# Patient Record
Sex: Female | Born: 1990 | Hispanic: No | Marital: Single | Smoking: Never smoker
Health system: Southern US, Community
[De-identification: ages and names within clinical notes are randomized; demographics above are authoritative.]

## PROBLEM LIST (undated history)

## (undated) DIAGNOSIS — Z9884 Bariatric surgery status: Secondary | ICD-10-CM

## (undated) DIAGNOSIS — M25569 Pain in unspecified knee: Secondary | ICD-10-CM

## (undated) HISTORY — PX: OTHER SURGICAL HISTORY: SHX169

## (undated) HISTORY — PX: ABDOMINAL SURGERY: SHX537

---
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
## 2010-07-07 IMAGING — CR DG ANKLE COMPLETE 3+V*L*
3 series · 3 of 3 positions shown · non-contrast
Comparison: None available.

CLINICAL DATA: Fall, pain.

LEFT ANKLE COMPLETE - 3+ VIEW

[t ankle joint ap left]
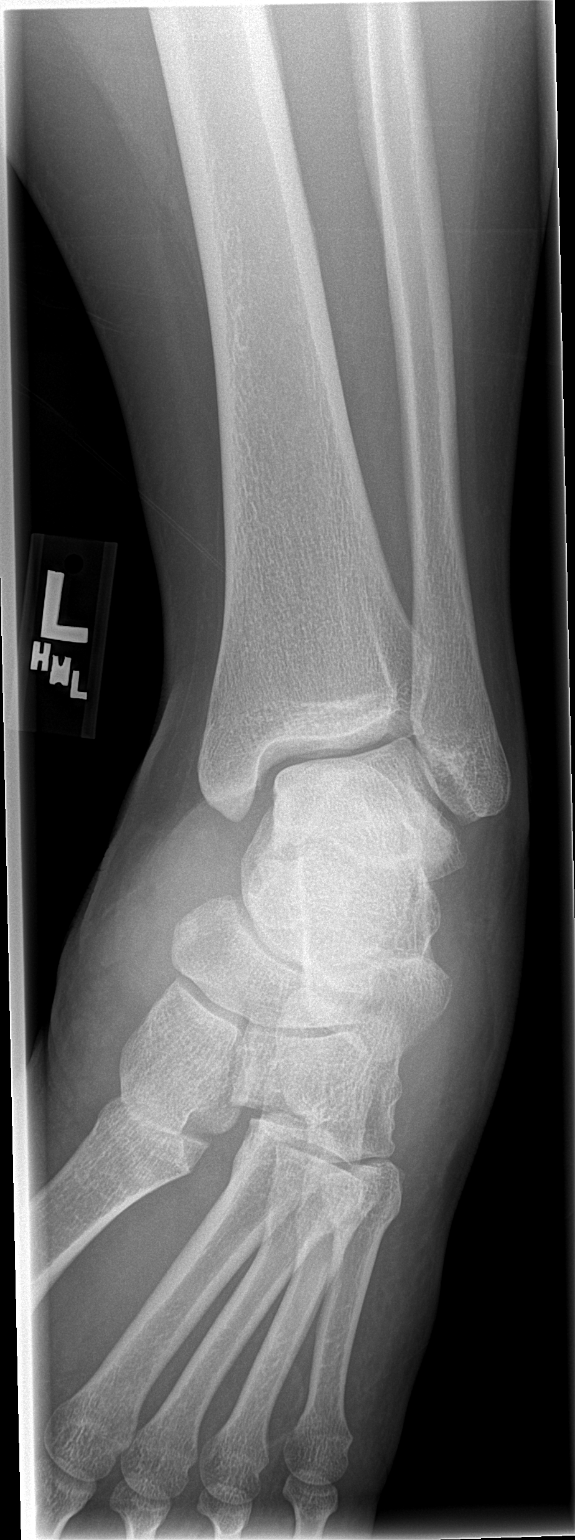

[t ankle joint oblique left]
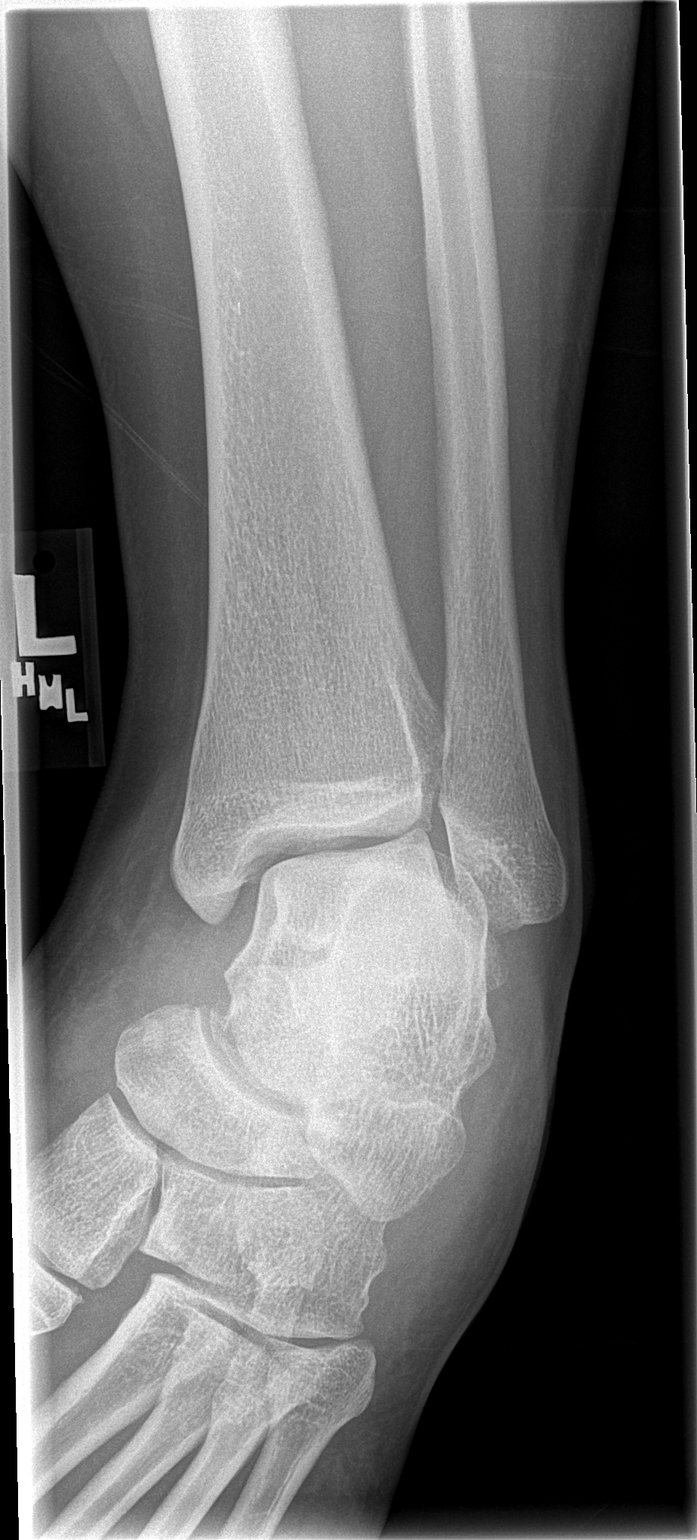

[t ankle joint lat left]
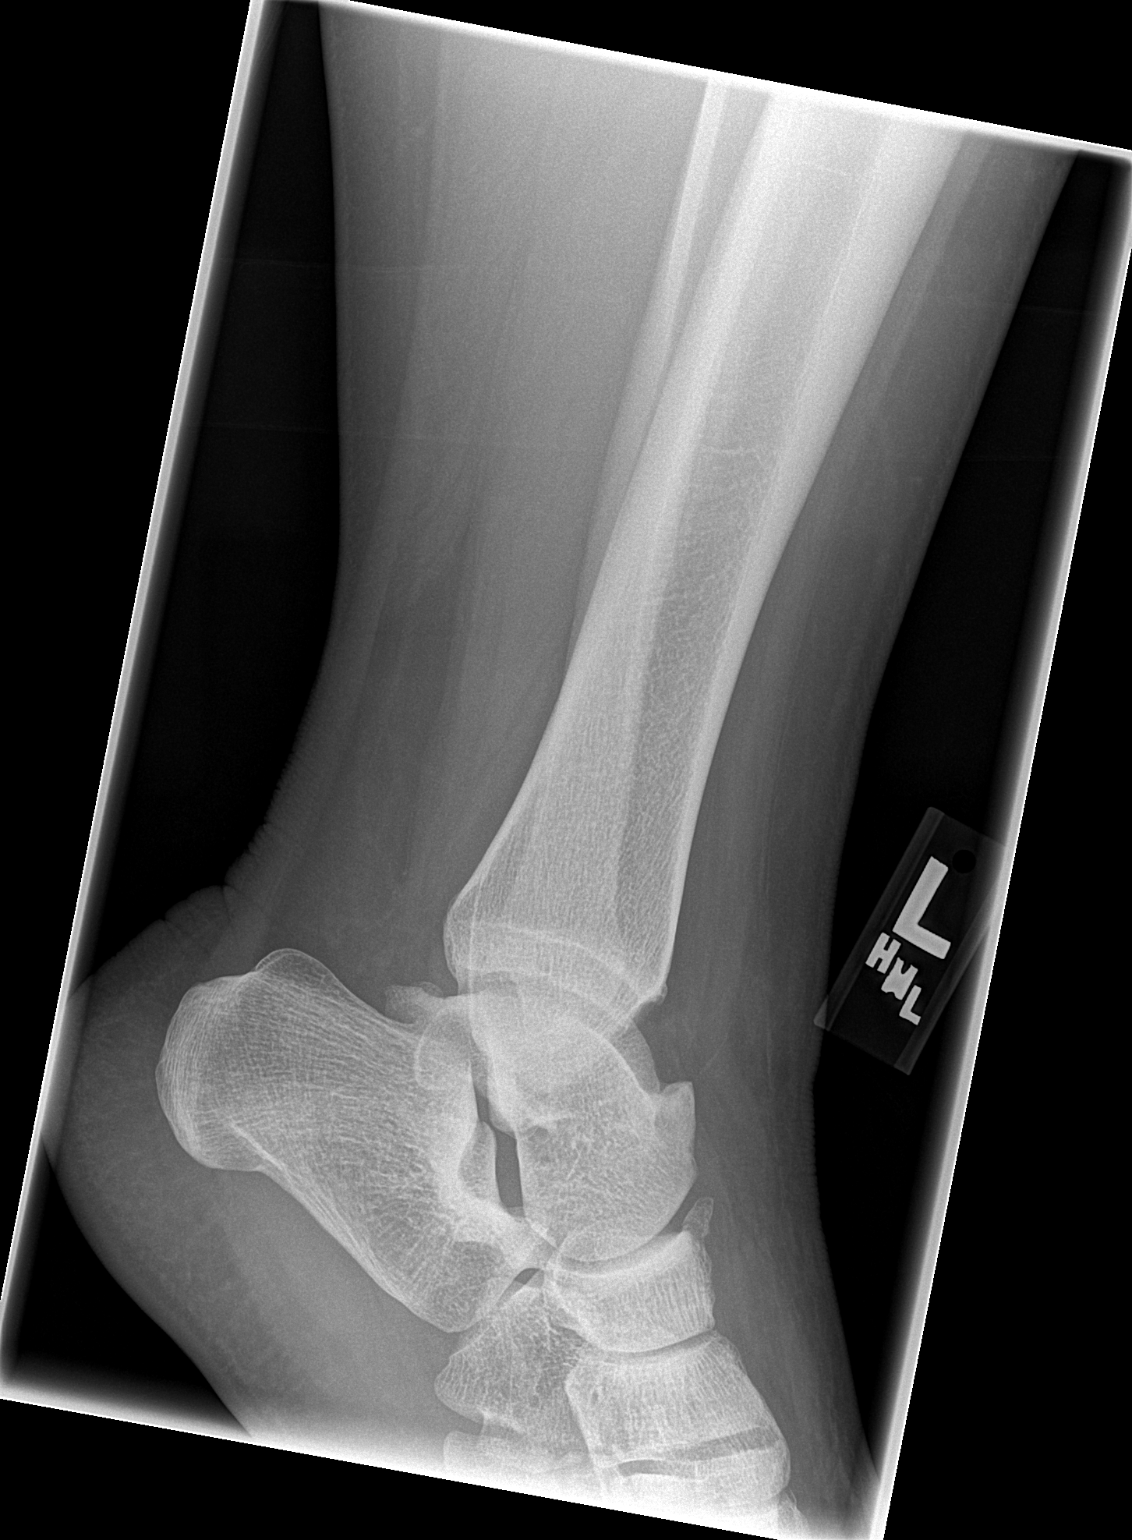

[3 of 3 positions shown; findings below may reference images not displayed]

FINDINGS: The middle and medial cuneiforms appear abnormally
widened as do the first and second metatarsals.  No discrete
fracture is identified.  Small accessory ossicle of the navicular
bone noted.  Soft tissues the foot appears swollen.
IMPRESSION: 1.  Findings worrisome for Lisfranc injury.  Consider MRI for
further evaluation.
2.  No fracture is identified.

REF:Z9 DICTATED: 01/08/2009 [DATE]

## 2010-07-07 IMAGING — CR DG ANKLE COMPLETE 3+V*R*
3 series · 3 of 3 positions shown · non-contrast
Comparison: None available.

CLINICAL DATA: Fall.  Ankle pain.

RIGHT ANKLE - COMPLETE 3+ VIEW

[t ankle joint ap right]
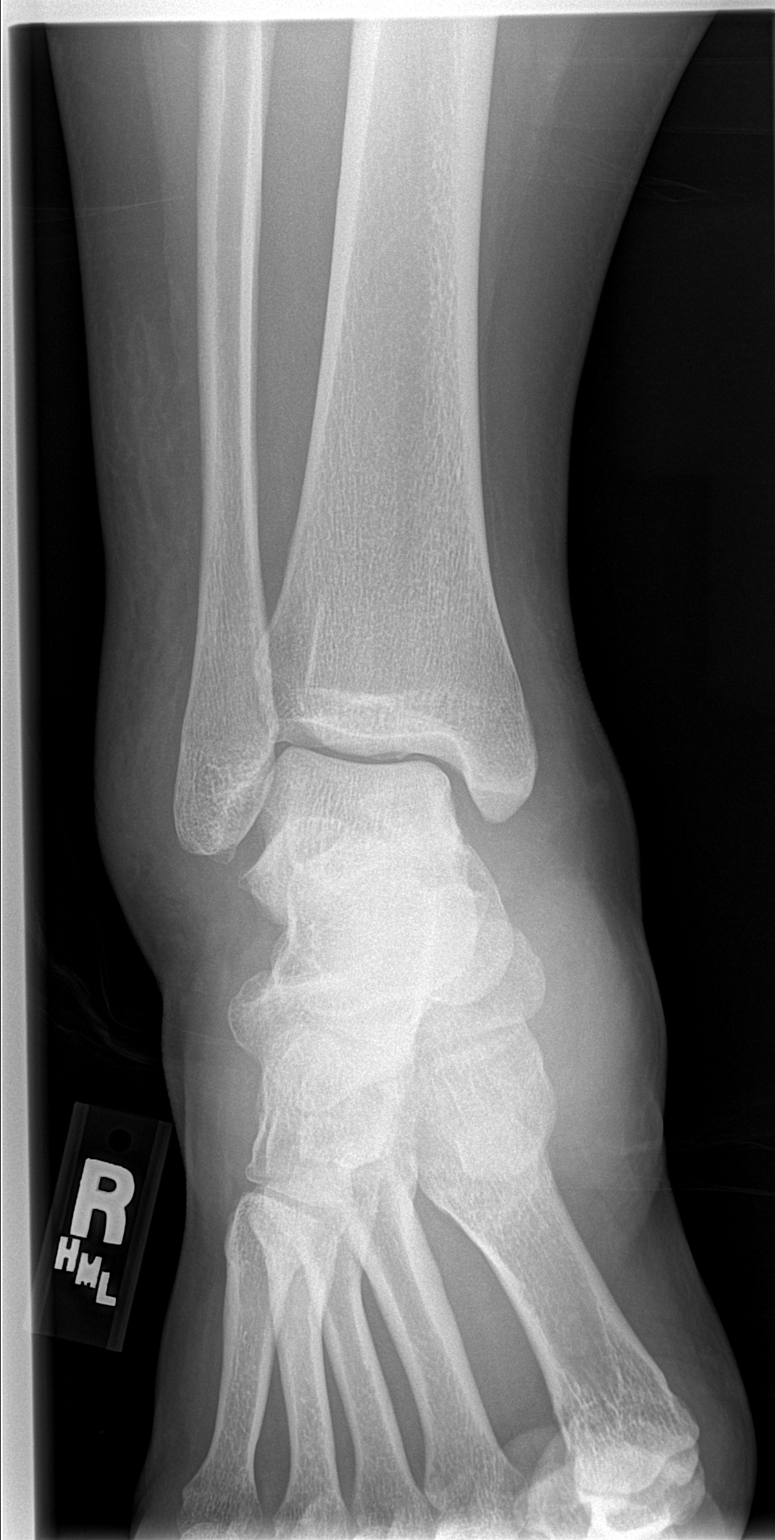

[t ankle joint oblique right]
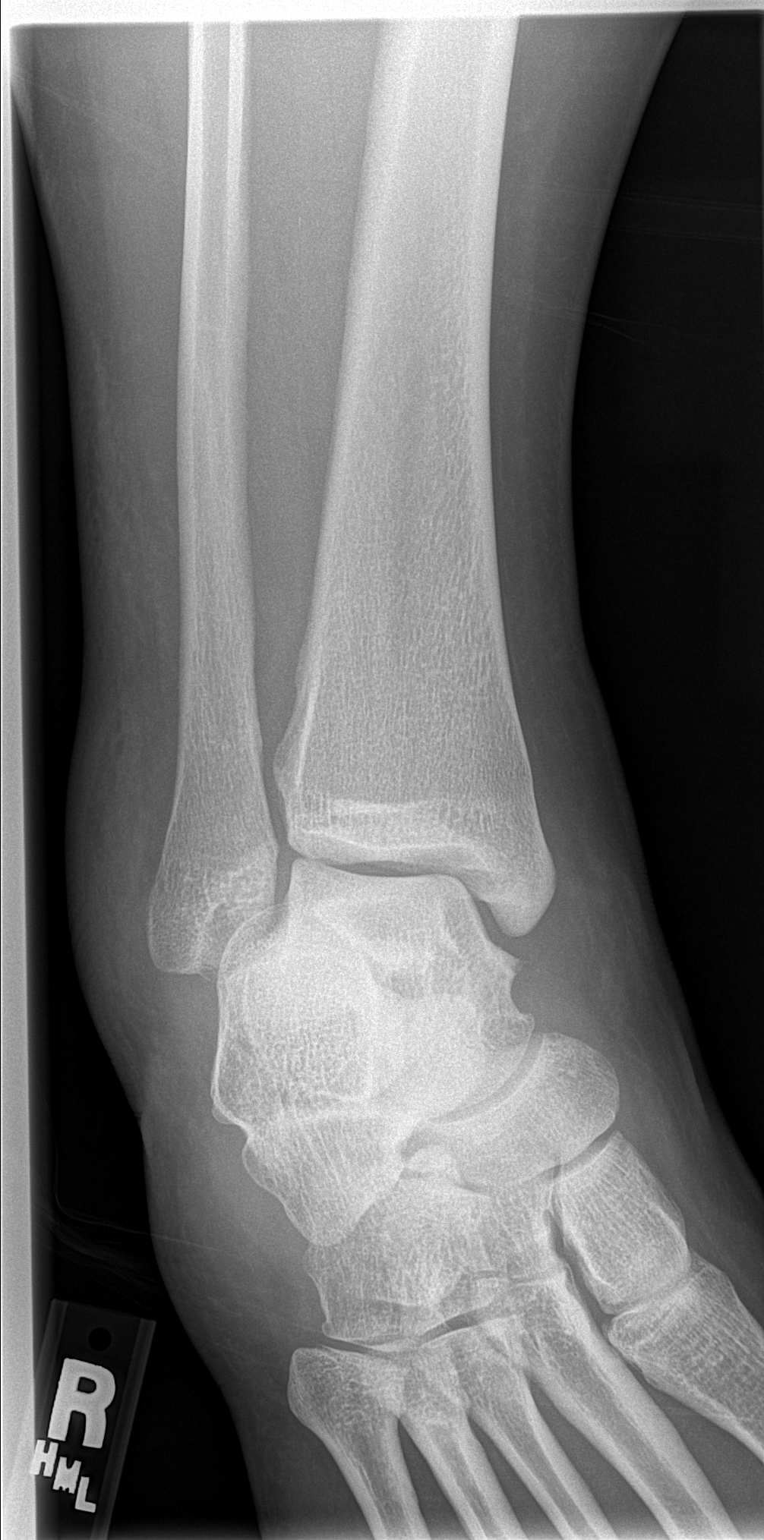

[t ankle joint lat right]
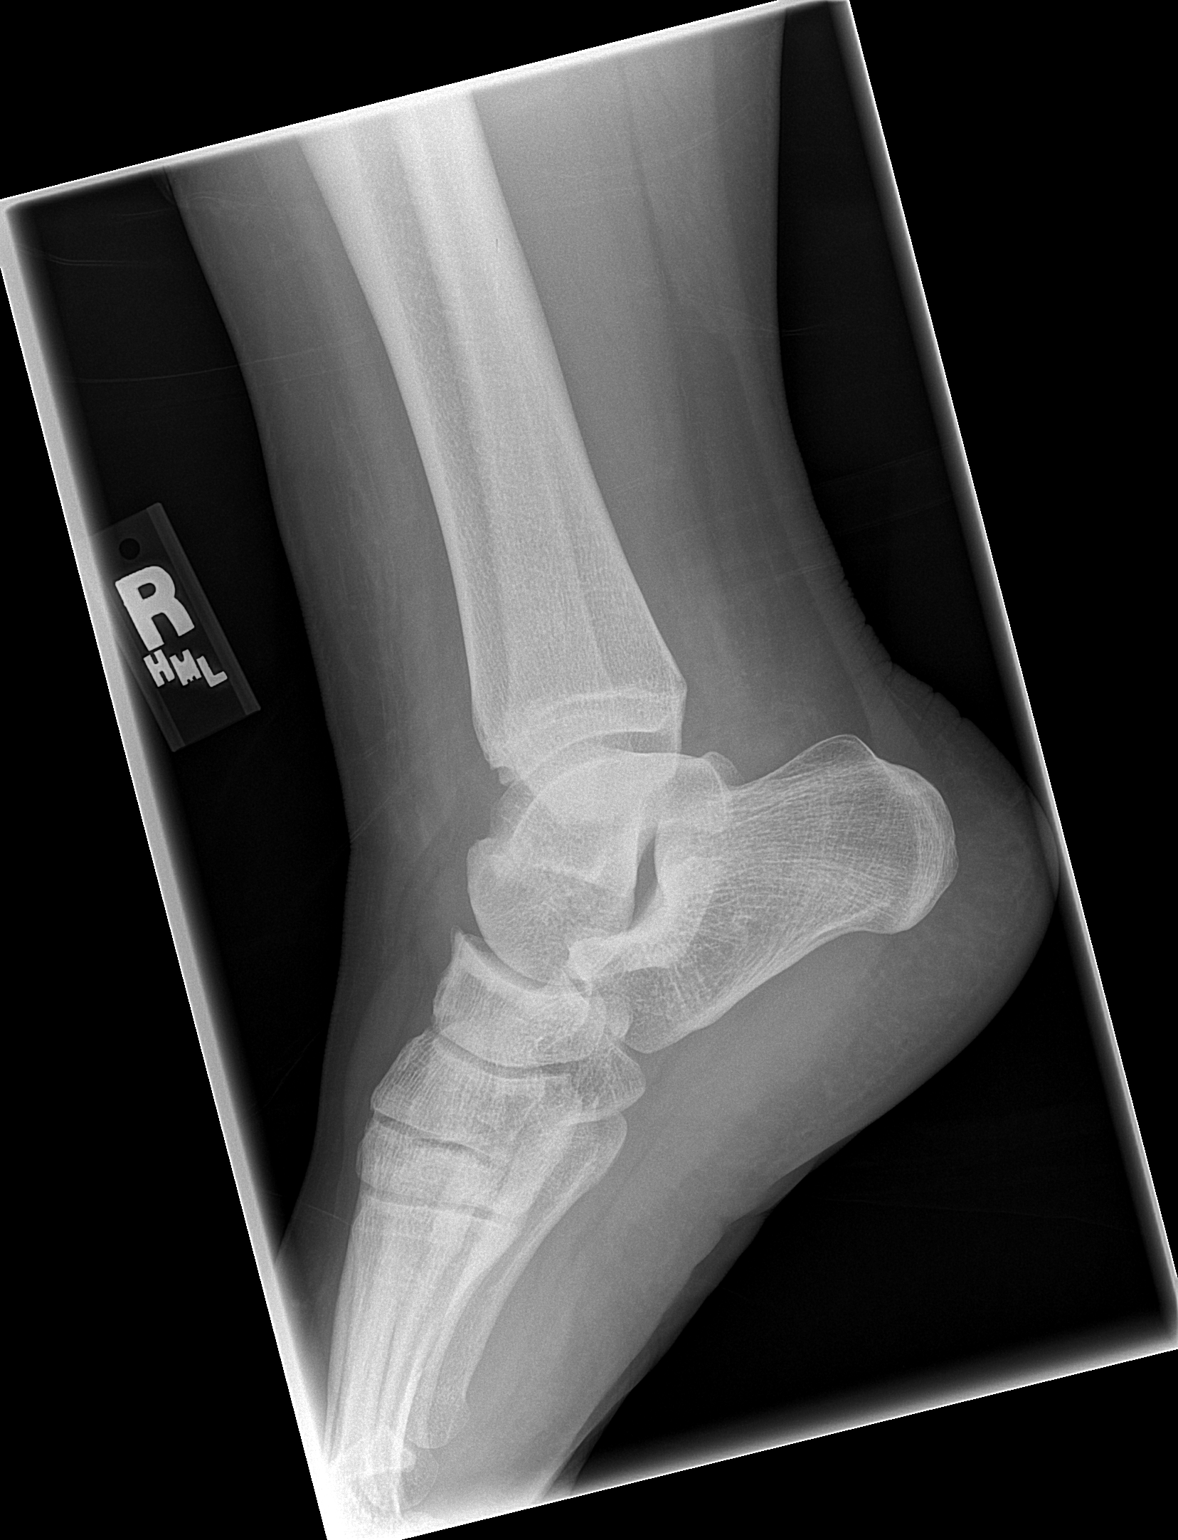

[3 of 3 positions shown; findings below may reference images not displayed]

FINDINGS: Imaged bones, joints and soft tissues appear normal.
IMPRESSION: Negative exam.

REF:Z9 DICTATED: 01/08/2009 [DATE]

## 2013-04-05 ENCOUNTER — Emergency Department (HOSPITAL_COMMUNITY)
Admission: EM | Admit: 2013-04-05 | Discharge: 2013-04-06 | Disposition: A | Discharge status: Home / Self Care | Payer: BC Managed Care – PPO | Attending: Emergency Medicine | Admitting: Emergency Medicine

## 2013-04-05 ENCOUNTER — Encounter (HOSPITAL_COMMUNITY): Payer: Self-pay | Admitting: Emergency Medicine

## 2013-04-05 DIAGNOSIS — L509 Urticaria, unspecified: Secondary | ICD-10-CM

## 2013-04-05 DIAGNOSIS — L739 Follicular disorder, unspecified: Secondary | ICD-10-CM | POA: Insufficient documentation

## 2013-04-05 DIAGNOSIS — L299 Pruritus, unspecified: Secondary | ICD-10-CM | POA: Insufficient documentation

## 2013-04-05 NOTE — ED Notes (Signed)
Pt states that she has been having allergic reactions for approx. A month. Has changed body wash but still has hives and itching.

## 2013-04-05 NOTE — ED Provider Notes (Signed)
History    This chart was scribed for non-physician practitioner Ebbie Ridge, PA-C working with Hanley Seamen, MD by Gerlean Ren, ED Scribe. This patient was seen in room WTR8/WTR8 and the patient's care was started at 11:43 PM.    CSN: [id number]  Arrival date & time 04/05/13  2330   First MD Initiated Contact with Patient 04/05/13 2336      No chief complaint on file.    The history is provided by the patient. No language interpreter was used.  Crystal Collier is a 22 y.o. female who presents to the Emergency Department complaining of itching rashes over pt's entire body that begins between 3:00-5:00 PM each day and will gradually improve until the evening when it will begin greatly worsening.  Pt reports this has been occuring for the past month.  Pt denies any new soaps, lotions, detergents.  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  Pt states she recently changed her birth control but stopped taking it altogether 2 weeks ago.   No past medical history on file.  No past surgical history on file.  No family history on file.  History  Substance Use Topics  . Smoking status: Not on file  . Smokeless tobacco: Not on file  . Alcohol Use: Not on file    OB History   No data available      Review of Systems A complete 10 system review of systems was obtained and all systems are negative except as noted in the HPI and PMH.   Allergies  Review of patient's allergies indicates not on file.  Home Medications  No current outpatient prescriptions on file.  There were no vitals taken for this visit.  Physical Exam  Nursing note and vitals reviewed. Constitutional: She is oriented to person, place, and time. She appears well-developed and well-nourished. No distress.  HENT:  Head: Normocephalic and atraumatic.  Eyes: Pupils are equal, round, and reactive to light.  Neck: Normal range of motion. Neck supple.   Cardiovascular: Normal rate, regular rhythm and normal heart sounds.  Exam reveals no gallop and no friction rub.   No murmur heard. Pulmonary/Chest: Effort normal and breath sounds normal. No respiratory distress. She has no wheezes.  Musculoskeletal: Normal range of motion.  Neurological: She is alert and oriented to person, place, and time.  Skin: Skin is warm and dry. Rash noted.  Patient does have some upper extremities, face, chest and back  Psychiatric: She has a normal mood and affect. Her behavior is normal.    ED Course  Procedures (including critical care time) DIAGNOSTIC STUDIES: No O2 taken.    COORDINATION OF CARE: 11:47 PM- Patient informed of clinical course, understands medical decision-making process, and agrees with plan.   Patient is referred to [facility].  Told to return here as, needed.  We'll give her a prescription for Pepcid, prednisone, and Benadryl.  Patient is advised to monitor, or any new variations in her soaps, lotions or detergents   MDM  I personally performed the services described in this documentation, which was scribed in my presence. The recorded information has been reviewed and is accurate.   Carlyle Dolly, PA-C 04/06/13 [phone]

## 2013-04-06 MED ORDER — FAMOTIDINE 20 MG PO TABS: 20.0000 mg | ORAL_TABLET | Freq: Two times a day (BID) | ORAL | Status: DC

## 2013-04-06 MED ORDER — DEXAMETHASONE SODIUM PHOSPHATE 10 MG/ML IJ SOLN
10.0000 mg | Freq: Once | INTRAMUSCULAR | Status: AC
  Administered 2013-04-06: 10 mg via INTRAVENOUS
  Filled 2013-04-06: qty 1

## 2013-04-06 MED ORDER — DIPHENHYDRAMINE HCL 50 MG/ML IJ SOLN
25.0000 mg | Freq: Once | INTRAMUSCULAR | Status: AC
  Administered 2013-04-06: 25 mg via INTRAMUSCULAR
  Filled 2013-04-06: qty 1

## 2013-04-06 MED ORDER — FAMOTIDINE 20 MG PO TABS
20.0000 mg | ORAL_TABLET | Freq: Once | ORAL | Status: AC
  Administered 2013-04-06: 20 mg via ORAL
  Filled 2013-04-06: qty 1

## 2013-04-06 MED ORDER — PREDNISONE 50 MG PO TABS: 50.0000 mg | ORAL_TABLET | Freq: Every day | ORAL | Status: DC

## 2013-04-06 NOTE — ED Provider Notes (Signed)
Medical screening examination/treatment/procedure(s) were performed by non-physician practitioner and as supervising physician I was immediately available for consultation/collaboration.   Hanley Seamen, MD 04/06/13 [phone]

## 2013-06-07 ENCOUNTER — Institutional Professional Consult (permissible substitution): Payer: BC Managed Care – PPO | Admitting: Internal Medicine

## 2013-11-30 ENCOUNTER — Ambulatory Visit: Payer: Self-pay

## 2019-10-11 ENCOUNTER — Emergency Department (INDEPENDENT_AMBULATORY_CARE_PROVIDER_SITE_OTHER)
Admission: EM | Admit: 2019-10-11 | Discharge: 2019-10-11 | Disposition: A | Discharge status: Home / Self Care | Payer: Managed Care, Other (non HMO) | Source: Home / Self Care | Attending: Family Medicine | Admitting: Family Medicine

## 2019-10-11 ENCOUNTER — Other Ambulatory Visit: Payer: Self-pay

## 2019-10-11 DIAGNOSIS — Z5189 Encounter for other specified aftercare: Secondary | ICD-10-CM | POA: Diagnosis not present

## 2019-10-11 MED ORDER — DOXYCYCLINE HYCLATE 100 MG PO CAPS: ORAL_CAPSULE | ORAL | 0 refills | Status: AC

## 2019-10-11 NOTE — ED Provider Notes (Signed)
[facility]    CSN: [id number] Arrival date & time: 10/11/19  1713      History   Chief Complaint Chief Complaint  Patient presents with  . Wound Infection    pt removed bilateral JP drains post skin surgery herself    HPI Crystal Collier is a 28 y.o. female.   Patient underwent a "tumm tuck" surgical procedure in [place] about 15 days ago.  Upon return to  she removed bilateral JP drains from each end of her lower abdominal incision because the drainage was beginning to appear purulent.  She has just finished a 7 day course of Keflex.  She denies fever, abdominal/pelvic pain, or increasing incisional pain.  The history is provided by the patient.    History reviewed. No pertinent past medical history.  There are no active problems to display for this patient.   Past Surgical History:  Procedure Laterality Date  . abdominal plasty      OB History   No obstetric history on file.      Home Medications    Prior to Admission medications   Medication Sig Start Date End Date Taking? Authorizing Provider  doxycycline (VIBRAMYCIN) 100 MG capsule Take one cap by mouth every 12 hours with food. 10/11/19   Lattie Haw, MD    Family History History reviewed. No pertinent family history.  Social History Social History   Tobacco Use  . Smoking status: Never Smoker  . Smokeless tobacco: Never Used  Substance Use Topics  . Alcohol use: Never    Frequency: Never  . Drug use: Never     Allergies   Patient has no known allergies.   Review of Systems Review of Systems  Constitutional: Negative for activity change, appetite change, chills, diaphoresis, fatigue and fever.  Respiratory: Negative for chest tightness and shortness of breath.   Cardiovascular: Negative for chest pain and palpitations.  Gastrointestinal: Negative for abdominal distention, abdominal pain and nausea.  Genitourinary: Negative.   Musculoskeletal: Negative.   Skin:  Positive for wound. Negative for color change.  All other systems reviewed and are negative.    Physical Exam Triage Vital Signs ED Triage Vitals  Enc Vitals Group     BP 10/11/19 1833 (!) 143/93     Pulse Rate 10/11/19 1833 94     Resp 10/11/19 1833 20     Temp 10/11/19 1833 98.5 F (36.9 C)     Temp Source 10/11/19 1833 Oral     SpO2 10/11/19 1833 100 %     Weight 10/11/19 1834 206 lb (93.4 kg)     Height 10/11/19 1834 5\' 5"  (1.651 m)     Head Circumference --      Peak Flow --      Pain Score 10/11/19 1834 0     Pain Loc --      Pain Edu? --      Excl. in [place]? --    No data found.  Updated Vital Signs BP (!) 143/93 (BP Location: Right Arm)   Pulse 94   Temp 98.5 F (36.9 C) (Oral)   Resp 20   Ht 5\' 5"  (1.651 m)   Wt 93.4 kg   LMP 09/26/2019 (Exact Date)   SpO2 100%   BMI 34.28 kg/m   Visual Acuity Right Eye Distance:   Left Eye Distance:   Bilateral Distance:    Right Eye Near:   Left Eye Near:    Bilateral Near:  Physical Exam Vitals signs reviewed.  Constitutional:      General: She is not in acute distress. Eyes:     Pupils: Pupils are equal, round, and reactive to light.  Cardiovascular:     Rate and Rhythm: Normal rate.  Pulmonary:     Effort: Pulmonary effort is normal.  Abdominal:     General: Abdomen is flat. There is no distension.     Tenderness: There is no guarding.       Comments: Lower abdominal surgical incision appears to be healing well with minimal tenderness to palpation.  There is no erythema or warmth.  At each end of the incision at site of JP drain removal there is some purulent drainage present. There is also a small amount of purulent drainage at incision site in umbilicus.  Neurological:     Mental Status: She is alert.      UC Treatments / Results  Labs (all labs ordered are listed, but only abnormal results are displayed) Labs Reviewed  WOUND CULTURE    EKG   Radiology No results found.  Procedures  Procedures (including critical care time)  Medications Ordered in UC Medications - No data to display  Initial Impression / Assessment and Plan / UC Course  I have reviewed the triage vital signs and the nursing notes.  Pertinent labs & imaging results that were available during my care of the patient were reviewed by me and considered in my medical decision making (see chart for details).    ? Developing surgical wound infection.  Will begin doxycycline for staph coverage.  Wound culture pending. Followup with Family Doctor if not improving about 4 days.   Final Clinical Impressions(s) / UC Diagnoses   Final diagnoses:  Visit for wound check     Discharge Instructions     Change bandages daily as you are presently doing. If symptoms become significantly worse during the night or over the weekend, proceed to the local emergency room.     ED Prescriptions    Medication Sig Dispense Auth. Provider   doxycycline (VIBRAMYCIN) 100 MG capsule Take one cap by mouth every 12 hours with food. 20 capsule Kandra Nicolas, MD        Kandra Nicolas, MD 10/12/19 [phone]

## 2019-10-11 NOTE — ED Triage Notes (Signed)
Pt presents to clinic for suspected wound infection. On 09/26/2019 she underwent a "tummy tuck" in [place]. Upon return to [place] she removed bilateral groin JP drains (x2) by herself due to yellow thick drainage coming from both sites. She has finished a 7 day course of Keflex and was prescribed flexeril and norco for pain. Pt has received influenza vaccine for this season.

## 2019-10-11 NOTE — Discharge Instructions (Addendum)
Change bandages daily as you are presently doing. If symptoms become significantly worse during the night or over the weekend, proceed to the local emergency room.

## 2019-10-15 ENCOUNTER — Telehealth: Payer: Self-pay

## 2019-10-15 LAB — WOUND CULTURE
MICRO NUMBER:: 1109414
SPECIMEN QUALITY:: ADEQUATE

## 2019-10-15 MED ORDER — CIPROFLOXACIN HCL 500 MG PO TABS: ORAL_TABLET | ORAL | 0 refills | Status: AC

## 2019-10-15 NOTE — Telephone Encounter (Signed)
Urine culture shows pseudomonas aeruginosa, sensitive to Cipro. Discontinue doxycycline and begin Cipro 500mg  BID for 10 days.

## 2019-11-28 ENCOUNTER — Emergency Department
Admission: EM | Admit: 2019-11-28 | Discharge: 2019-11-28 | Disposition: A | Discharge status: Home / Self Care | Payer: Managed Care, Other (non HMO) | Source: Home / Self Care | Attending: Family Medicine | Admitting: Family Medicine

## 2019-11-28 ENCOUNTER — Other Ambulatory Visit: Payer: Self-pay

## 2019-11-28 ENCOUNTER — Encounter: Payer: Self-pay | Admitting: Emergency Medicine

## 2019-11-28 DIAGNOSIS — Z20822 Contact with and (suspected) exposure to covid-19: Secondary | ICD-10-CM

## 2019-11-28 DIAGNOSIS — R059 Cough, unspecified: Secondary | ICD-10-CM

## 2019-11-28 DIAGNOSIS — R05 Cough: Secondary | ICD-10-CM

## 2019-11-28 LAB — POC SARS CORONAVIRUS 2 AG -  ED: SARS Coronavirus 2 Ag: NEGATIVE

## 2019-11-28 NOTE — ED Triage Notes (Signed)
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 No OTCs. Has had influenza vacc this season.

## 2019-11-28 NOTE — ED Provider Notes (Signed)
[facility]    CSN: [id number] Arrival date & time: 11/28/19  1008      History   Chief Complaint Chief Complaint  Patient presents with  . Cough  . Generalized Body Aches  . Headache  . Fatigue    HPI Crystal Collier is a 29 y.o. female.   Patient developed a cough 3 days ago, with fatigue, myalgias, mild nasal congestion, minimal sore throat, and chills.  She has had a headache today.   She denies chest tightness, shortness of breath, and changes in taste/smell.    The history is provided by the patient.    History reviewed. No pertinent past medical history.  There are no problems to display for this patient.   Past Surgical History:  Procedure Laterality Date  . abdominal plasty    . ABDOMINAL SURGERY     weight reduction    OB History   No obstetric history on file.      Home Medications    Prior to Admission medications   Medication Sig Start Date End Date Taking? Authorizing Provider  ciprofloxacin (CIPRO) 500 MG tablet Take one tab PO Q12hr. 10/15/19   Kandra Nicolas, MD  doxycycline (VIBRAMYCIN) 100 MG capsule Take one cap by mouth every 12 hours with food. 10/11/19   Kandra Nicolas, MD    Family History No family history on file.  Social History Social History   Tobacco Use  . Smoking status: Never Smoker  . Smokeless tobacco: Never Used  Substance Use Topics  . Alcohol use: Never  . Drug use: Never     Allergies   Patient has no known allergies.   Review of Systems Review of Systems + minimal sore throat + cough No pleuritic pain No wheezing + nasal congestion + post-nasal drainage No sinus pain/pressure No itchy/red eyes No earache No hemoptysis No SOB No fever, + chills No nausea No vomiting No abdominal pain No diarrhea No urinary symptoms No skin rash + fatigue + myalgias + headache    Physical Exam Triage Vital Signs ED Triage Vitals [11/28/19 1113]  Enc Vitals Group     BP (!) 149/95       Pulse Rate 90     Resp 18     Temp 98.5 F (36.9 C)     Temp Source Oral     SpO2 100 %     Weight 200 lb (90.7 kg)     Height 5\' 5"  (1.651 m)     Head Circumference      Peak Flow      Pain Score 2     Pain Loc      Pain Edu?      Excl. in [place]?    No data found.  Updated Vital Signs BP (!) 149/95 (BP Location: Right Arm)   Pulse 90   Temp 98.5 F (36.9 C) (Oral)   Resp 18   Ht 5\' 5"  (1.651 m)   Wt 90.7 kg   LMP 11/05/2019 (Exact Date)   SpO2 100%   BMI 33.28 kg/m   Visual Acuity Right Eye Distance:   Left Eye Distance:   Bilateral Distance:    Right Eye Near:   Left Eye Near:    Bilateral Near:     Physical Exam Nursing notes and Vital Signs reviewed. Appearance:  Patient appears stated age, and in no acute distress Eyes:  Pupils are equal, round, and reactive to light and accomodation.  Extraocular  movement is intact.  Conjunctivae are not inflamed  Ears:  Canals are occluded with cerumen. Nose:   Normal turbinates.  No sinus tenderness.   Pharynx:  Normal Neck:  Supple.  Mildly enlarged lateral nodes are present, tender to palpation on the left.   Lungs:  Clear to auscultation.  Breath sounds are equal.  Moving air well. Heart:  Regular rate and rhythm without murmurs, rubs, or gallops.  Abdomen:  Nontender without masses or hepatosplenomegaly.  Bowel sounds are present.  No CVA or flank tenderness.  Extremities:  No edema.  Skin:  No rash present.   UC Treatments / Results  Labs (all labs ordered are listed, but only abnormal results are displayed) Labs Reviewed  NOVEL CORONAVIRUS, NAA  POC SARS CORONAVIRUS 2 AG -  ED negative    EKG   Radiology No results found.  Procedures Procedures (including critical care time)  Medications Ordered in UC Medications - No data to display  Initial Impression / Assessment and Plan / UC Course  I have reviewed the triage vital signs and the nursing notes.  Pertinent labs & imaging results that were  available during my care of the patient were reviewed by me and considered in my medical decision making (see chart for details).    Benign exam.  There is no evidence of bacterial infection today.  Treat symptomatically for now  COVID19 send out   Final Clinical Impressions(s) / UC Diagnoses   Final diagnoses:  Exposure to COVID-19 virus  Cough     Discharge Instructions     Increase Vitamin D3 to 5000 units daily, and Vitamin C 500mg  twice daily.  Isolate yourself until COVID-19 test result is available.   If your COVID19 test is positive, then you are infected with the novel coronavirus and could give the virus to others.  Please continue isolation at home for at least 10 days since the start of your symptoms.   Once you complete your 10 day quarantine, you may return to normal activities as long as you've not had a fever for over 24 hours (without taking fever reducing medicine) and your symptoms are improving. Please continue good preventive care measures, including:  frequent hand-washing, avoid touching your face, cover coughs/sneezes, stay out of crowds and keep a 6 foot distance from others.  Go to the nearest hospital emergency room if fever/cough/breathlessness are severe or illness seems like a threat to life.    ED Prescriptions    None        , MD 12/04/19 1306

## 2019-11-28 NOTE — Discharge Instructions (Addendum)
Increase Vitamin D3 to 5000 units daily, and Vitamin C 500mg  twice daily.  Isolate yourself until COVID-19 test result is available.   If your COVID19 test is positive, then you are infected with the novel coronavirus and could give the virus to others.  Please continue isolation at home for at least 10 days since the start of your symptoms.   Once you complete your 10 day quarantine, you may return to normal activities as long as you've not had a fever for over 24 hours (without taking fever reducing medicine) and your symptoms are improving. Please continue good preventive care measures, including:  frequent hand-washing, avoid touching your face, cover coughs/sneezes, stay out of crowds and keep a 6 foot distance from others.  Go to the nearest hospital emergency room if fever/cough/breathlessness are severe or illness seems like a threat to life.

## 2019-11-29 LAB — NOVEL CORONAVIRUS, NAA: SARS-CoV-2, NAA: DETECTED — AB

## 2019-11-30 ENCOUNTER — Telehealth: Payer: Self-pay | Admitting: Emergency Medicine

## 2019-11-30 NOTE — Telephone Encounter (Signed)

## 2019-11-30 NOTE — Telephone Encounter (Signed)
Patient contacted by phone and made aware of  positive covid  results. Pt verbalized understanding and had all questions answered.    

## 2020-08-09 NOTE — Progress Notes (Signed)
Formatting of this note might be different from the original.  Patient does not have any questions or concerns at this time.   Electronically signed by Roland Earl Villatoro-Pineda at 08/09/2020  3:06 PM EDT

## 2020-08-09 NOTE — Progress Notes (Signed)
Formatting of this note might be different from the original.  RETURN OB VISIT SUMMARY    29 y.o. G1P0000 at [redacted]w[redacted]d     SUBJECTIVE:  The patient has no unusual complaints.    OBJECTIVE:  Physical Exam:  SEE PRENATAL FLOWSHEET    ASSESSMENT:  Normal pregnancy    PLAN:  Routine prenatal care  F/u US in 8 weeks     Electronically signed by Alvester Morin, PA at 08/09/2020  3:06 PM EDT

## 2022-10-30 LAB — HEMOGLOBIN A1C
Estimated Avg Glucose, External: 96 mg/dL (ref 91–123)
Hemoglobin A1C, External: 5 % (ref 4.8–5.6)

## 2023-02-24 ENCOUNTER — Inpatient Hospital Stay: Admit: 2023-02-24 | Payer: BLUE CROSS/BLUE SHIELD | Primary: Diagnostic Radiology

## 2023-02-25 LAB — HEPATITIS B SURFACE ANTIBODY QUANT: Hep B S Ab: 51 m[IU]/mL

## 2023-02-26 LAB — VARICELLA ZOSTER ANTIBODY, IGG: Varicella-Zoster Virus Ab, Igg: 2.94 (ref 0.90–999.00)

## 2023-02-26 LAB — MUMPS ANTIBODY, IGG: Mumps IgG: 0.94 (ref 0.50–999.00)

## 2023-02-26 LAB — RUBEOLA ANTIBODY IGG: MEASLES ANTIBODY IGG: 1.03 (ref 0.70–999.00)

## 2023-02-28 LAB — QUANTIFERON, INCUBATED
Mitogen-NIL: 7.54 IU/mL
NIL: 0.01 IU/mL
QuantiFERON-TB Plus,4T: NEGATIVE
TB1-NIL: 0 IU/mL
TB2-NIL: 0 IU/mL

## 2023-02-28 LAB — RUBELLA ANTIBODY, IGG: Rubella Ab (IgG): REACTIVE

## 2023-05-28 ENCOUNTER — Inpatient Hospital Stay: Admit: 2023-05-28 | Discharge: 2023-05-28 | Payer: BLUE CROSS/BLUE SHIELD | Primary: Diagnostic Radiology

## 2023-05-28 ENCOUNTER — Inpatient Hospital Stay: Admit: 2023-05-28 | Discharge: 2023-05-28 | Payer: BLUE CROSS/BLUE SHIELD

## 2023-05-28 ENCOUNTER — Encounter

## 2023-05-28 ENCOUNTER — Inpatient Hospital Stay: Admit: 2023-05-28 | Payer: BLUE CROSS/BLUE SHIELD

## 2023-05-28 DIAGNOSIS — Z01810 Encounter for preprocedural cardiovascular examination: Secondary | ICD-10-CM

## 2023-05-28 LAB — CBC WITH AUTO DIFFERENTIAL
Basophils: 0.4 % (ref 0–3)
Eosinophils: 1.6 % (ref 0–5)
Hematocrit: 38.2 % (ref 35.0–47.0)
Hemoglobin: 11.4 gm/dl (ref 11.0–16.0)
Immature Granulocytes %: 0.5 % (ref 0.0–3.0)
Lymphocytes: 29.3 % (ref 28–48)
MCH: 20.3 pg — ABNORMAL LOW (ref 25.4–34.6)
MCHC: 29.8 gm/dl — ABNORMAL LOW (ref 30.0–36.0)
MCV: 68 fL — ABNORMAL LOW (ref 80.0–98.0)
MPV: 11.6 fL — ABNORMAL HIGH (ref 6.0–10.0)
Monocytes: 6.1 % (ref 1–13)
Neutrophils Segmented: 62.1 % (ref 34–64)
Nucleated RBCs: 0 (ref 0–0)
Platelets: 343 10*3/uL (ref 140–450)
RBC: 5.62 M/uL — ABNORMAL HIGH (ref 3.60–5.20)
RDW: 38.2 (ref 36.4–46.3)
WBC: 11.1 10*3/uL — ABNORMAL HIGH (ref 4.0–11.0)

## 2023-05-28 LAB — COMPREHENSIVE METABOLIC PANEL
ALT: 51 U/L — ABNORMAL HIGH (ref 10–49)
AST: 24 U/L (ref 0.0–33.9)
Albumin: 3.7 gm/dl (ref 3.4–5.0)
Alkaline Phosphatase: 55 U/L (ref 46–116)
Anion Gap: 9 mmol/L (ref 5–15)
BUN: 15 mg/dl (ref 9–23)
CO2: 24 mEq/L (ref 20–31)
Calcium: 9.2 mg/dl (ref 8.7–10.4)
Chloride: 104 mEq/L (ref 98–107)
Creatinine: 0.64 mg/dl (ref 0.55–1.02)
GFR African American: >60
GFR Non-African American: >60
Glucose: 47 mg/dl — CL (ref 74–106)
Potassium: 3.6 mEq/L (ref 3.5–5.1)
Sodium: 137 mEq/L (ref 136–145)
Total Bilirubin: 0.5 mg/dl (ref 0.30–1.20)
Total Protein: 7.6 gm/dl (ref 5.7–8.2)

## 2023-05-28 LAB — FERRITIN: Ferritin: 57.5 ng/ml (ref 7.3–270.7)

## 2023-05-28 LAB — TSH: TSH, High Sensitivity: 3.259 u[IU]/mL (ref 0.550–4.780)

## 2023-05-28 LAB — LIPID PANEL
Chol/HDL Ratio: 4.2 Ratio (ref 0.0–4.4)
Cholesterol, Total: 169 mg/dl (ref 0–199)
HDL: 40 mg/dl (ref 40–60)
LDL Cholesterol: 89 mg/dl (ref 0–130)
Triglycerides: 199 mg/dl — ABNORMAL HIGH (ref 0–150)

## 2023-05-28 LAB — HEMOGLOBIN A1C: Hemoglobin A1C: 4.8 % (ref 3.8–5.6)

## 2023-05-28 LAB — VITAMIN B12: Vitamin B-12: 784 pg/ml (ref 211–911)

## 2023-05-29 LAB — EKG 12-LEAD
Atrial Rate: 82 {beats}/min
Calculated P Axis: 52 degrees
Calculated R Axis: 55 degrees
Calculated T Axis: 41 degrees
DIAGNOSIS, 93000: NORMAL
P-R Interval: 144 ms
Q-T Interval: 392 ms
QRS Duration: 92 ms
QTC Calculation (Bezet): 457 ms
Ventricular Rate: 82 {beats}/min

## 2023-06-02 ENCOUNTER — Encounter: Primary: Diagnostic Radiology

## 2023-06-02 ENCOUNTER — Inpatient Hospital Stay
Admit: 2023-06-02 | Discharge: 2023-06-02 | Disposition: A | Discharge status: Home / Self Care | Payer: BLUE CROSS/BLUE SHIELD | Attending: Emergency Medicine

## 2023-06-02 ENCOUNTER — Emergency Department: Admit: 2023-06-02 | Payer: BLUE CROSS/BLUE SHIELD | Primary: Diagnostic Radiology

## 2023-06-02 DIAGNOSIS — M1712 Unilateral primary osteoarthritis, left knee: Secondary | ICD-10-CM

## 2023-06-02 LAB — VITAMIN B1, WHOLE BLOOD: Vitamin B1,Whole Blood: 87 nmol/L (ref 78–185)

## 2023-06-02 MED ORDER — METHYLPREDNISOLONE 4 MG PO TBPK
4 | PACK | ORAL | 0 refills | Status: AC
Start: 2023-06-02 — End: 2023-06-08

## 2023-06-02 NOTE — ED Notes (Signed)
Splint applied to left leg/knee by ER tech and inspected by MD.     12:20 PM  06/02/23     Discharge instructions given to patient (name) with verbalization of understanding. Patient accompanied by patient.  Patient discharged with the following prescriptions      Medication List        START taking these medications      methylPREDNISolone 4 MG tablet  Commonly known as: MEDROL (PAK)  Take by mouth.               Where to Get Your Medications        These medications were sent to [facility] [address] [place], [place] - [address] - [place]et[place]us Charity [phone] Carmon Ginsberg [phone]  [address] [place] [address]      Phone: [phone]   methylPREDNISolone 4 MG tablet     . Patient discharged to Home.      Gerhard Munch, RN      Gerhard Munch, RN  06/02/23 [phone]
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
## 2023-06-02 NOTE — ED Triage Notes (Addendum)
NOT FC appropriate d/t HTN    Pt fell Friday to knees  Saturday was at beach  Today could not put weight on leg

## 2023-06-02 NOTE — Discharge Instructions (Signed)
Posterior splint left knee for comfort.  Limit ambulation and use wheelchair and crutches.  MRI tomorrow as planned.  Follow-up with your orthopedic doctors.  Follow-up primary care for evaluation of blood pressure.    Results for orders placed or performed during the hospital encounter of 06/02/23   XR KNEE LEFT (3 VIEWS)    Narrative    INDICATION:  injury    EXAMINATION:  XR KNEE LEFT (3 VIEWS)    COMPARISON:  None    FINDINGS:  There is no fracture or dislocation. There is moderately severe degeneration and  also small joint effusion.      Impression    IMPRESSION:    1. Negative for fracture.  2. Moderately severe osteoarthritis.  3. Small joint effusion.    Electronically signed by: Cheryln Manly, MD 06/02/2023 10:24 AM EDT            Workstation ID: [id number]

## 2023-06-03 ENCOUNTER — Encounter

## 2023-06-09 ENCOUNTER — Inpatient Hospital Stay: Admit: 2023-06-09 | Payer: BLUE CROSS/BLUE SHIELD | Primary: Diagnostic Radiology

## 2023-06-09 NOTE — Progress Notes (Signed)
Dear Dr. Lenise Arena :    The patient is a 32 year old female seen today for a group nutrition class in preparation for bariatric surgery. They are planning to have the gastric bypass procedure.   This was patient's 1 of 4 required nutrition visits as part of a weight loss trial, per insurance provider requirement.    Ht:     65   inches  Wt: 295.0  pounds today        298.25  pounds at consult on 05/23/2023  Body mass index is 49.09 kg/m    New Patient Evaluation Form    Overall Health Goals To feel better, to be lighter so I can be more active for myself and my family   Previous approaches to lose weight Reduction of calories and exercise   Food Allergies or Restrictions None   Who does the grocery shopping at your house? Self   Who does the food preparation at your house? Self   How often do you eat restaurant food? 1-2 times per week   Do you drink alcohol? If so how much and how often?  No       Introduced patients to Bariatric Principles and engaged in group conversation regarding the benefit of pre-operative lifestyle changes. Provided patients with pre-operative nutrition goals (see below) and engaged in group conversation regarding anticipated barriers to change and ways to overcome perceived barriers.    Provided education on the Plate Method for Meal Planning, using food models and measuring cups to assist with visualizing recommended serving sizes. Discussed food groups, provided examples of food sources in each group, and assisted patients with distinguishing between starchy and non-starchy vegetables. Educated patients on the importance of eating 3 meals/day at regularly scheduled times including breakfast within 1-2 hours of waking. Provided education on label reading with recommendations for fat and sugar content and practiced reading food labels as a group.    Patients were advised of the benefits of regular self-monitoring food intake and provided with examples of detailed food logs. They were educated  on multiple methods of keeping food logs. The psychology of hunger was discussed, and patients were assisted with identifying various internal and external eating cues.     The patient may require a pre-operative liver reduction diet to further reduce risk of surgery if they have a BMI above 50.    The need to start an exercise regimen was discussed. Benefits of exercise post surgery were discussed.     Patients were advised to study the pre op goals class packet as well as the bariatric patient manual. They are to read pages 1-13 and answer the questions on page 13 to be reviewed at their next appointment. They are also expected to bring a minimum of 15 days of food records to their next appointment.     Patients next pre-op nutrition visit has been scheduled for one month from now. In the interim, the patient was encouraged to attend bariatric support group and has been provided upcoming dates as well as my contact information for any follow up questions or concerns as needed.    Patricia Oneill, RDN, CDCES  Registered Dietitian Nutritionist  [facility]  [address] [place], [place], [place] [address]  [phone]    Nutrition Goals:  Keep a daily food diary - Bring to every appointment  Do not skip meals - goal of 3 meals/day, limit snacking between meals.  Eat within 1 -1.5 hours  of waking.   Recommend using the healthy snack list or acceptable protein supplement as an option for a meal instead of skipping a meal.  Eat every 4-6 hours.  Use the plate method diagram (picture of plate with servings from each food group) and/or meal plan provided by your Dietitian.  Incorporate more lean protein - eat protein first at meals.  Goal of 60-75 grams (g) protein per day.   of your plate should be non-starchy vegetables (refer to vegetable list).  Limit starches for each meal to 1/3-1/2 cup (e.g. corn, peas, potatoes, pasta, rice, beans).  Reduce and work towards eliminating  fried foods.  Reduce and work towards eliminating refined carbohydrates (chips, cookies - no snacks or foods outside of label reading guidelines for grams of sugar and fat).  Fluids:  Drink 64 oz. of fluid per day; water is highly recommended (avoid carbonation (bubbles) and sugar sweetened beverages).  Start cutting back on caffeine - goal is zero caffeine by your last appointment.  Do not use straws.  Read food labels:  Before surgery keep added sugar to 9 g or less for yogurt, 5 g or less for cereals, and 4 g or less for jelly and jam.  After surgery keep added sugar and total fat to 3 g or less (3 gram rule).  Eating practices:  Start sampling protein supplements (supplement should contain less than 3 g of fat and sugar and provide 20 g or more of protein per 8-12 oz. serving).  Refer to acceptable protein supplement list.  Start practicing the 30-30 rule; do not drink fluids 30 minutes before or after meals.  Chew, chew, chew - chew each bite of food 25 - 30 times.  Eat slowly; one meal should take 30 minutes to finish.  Lifestyle behaviors:  Tobacco cessation 3 months prior to surgery- NO SMOKING EVER  Abstain from alcohol for 3 months prior to surgery - no alcohol for one year after surgery.  Exercise a minimum 150 minutes per week (30 minutes per day, 5 days per week) - ultimate goal is to exercise 7 days per week.  Aim to get 7-8 hours of sleep nightly.  Implement ways to manage stress.  Find vitamin and mineral supplements that work for you and purchase PRIOR TO surgery.   Start the Liver Reduction Diet 14 days before surgery date.    Call my Registered Dietitian Nutritionist, Patricia Oneill, RD, at [phone] if I have any questions.

## 2023-07-09 ENCOUNTER — Inpatient Hospital Stay: Admit: 2023-07-09 | Payer: BLUE CROSS/BLUE SHIELD | Primary: Diagnostic Radiology

## 2023-07-09 NOTE — Discharge Instructions (Signed)
Nutrition Goals:  Keep a daily food diary - Bring to every appointment  Do not skip meals - goal of 3 meals/day, limit snacking between meals.  Eat within 1 -1.5 hours of waking.   Recommend using the healthy snack list or acceptable protein supplement as an option for a meal instead of skipping a meal.  Eat every 4-6 hours.  Use the plate method diagram (picture of plate with servings from each food group) and/or meal plan provided by your Dietitian.  Incorporate more lean protein - eat protein first at meals.  Goal of 60-75 grams (g) protein per day.   of your plate should be non-starchy vegetables (refer to vegetable list).  Limit starches for each meal to 1/3-1/2 cup (e.g. corn, peas, potatoes, pasta, rice, beans).  Reduce and work towards eliminating fried foods.  Reduce and work towards eliminating refined carbohydrates (chips, cookies - no snacks or foods outside of label reading guidelines for grams of sugar and fat).  Fluids:  Drink 64 oz. of fluid per day; water is highly recommended (avoid carbonation (bubbles) and sugar sweetened beverages).  Start cutting back on caffeine - goal is zero caffeine by your last appointment.  Do not use straws.  Read food labels:  Before surgery keep added sugar to 9 g or less for yogurt, 5 g or less for cereals, and 4 g or less for jelly and jam.  After surgery keep added sugar and total fat to 3 g or less (3 gram rule).  Eating practices:  Start sampling protein supplements (supplement should contain less than 3 g of fat and sugar and provide 20 g or more of protein per 8-12 oz. serving).  Refer to acceptable protein supplement list.  Start practicing the 30-30 rule; do not drink fluids 30 minutes before or after meals.  Chew, chew, chew - chew each bite of food 25 - 30 times.  Eat slowly; one meal should take 30 minutes to finish.  Lifestyle behaviors:  Tobacco cessation 3 months prior to surgery- NO SMOKING EVER  Abstain from alcohol for 3 months prior to surgery - no  alcohol for one year after surgery.  Exercise a minimum 150 minutes per week (30 minutes per day, 5 days per week) - ultimate goal is to exercise 7 days per week.  Aim to get 7-8 hours of sleep nightly.  Implement ways to manage stress.  Find vitamin and mineral supplements that work for you and purchase PRIOR TO surgery.   Start the Pre-Operative Full Liquid Diet in the day leading up to surgery.    Call my Registered Dietitian Vista Deck, RDN, at [phone] if I have any questions.

## 2023-07-09 NOTE — Progress Notes (Signed)
Dear Dr. Lenise Arena:    Patricia Oneill is a 32 y.o. female seen today for nutrition counseling and Medical Nutrition Therapy for Roux-en-Y Gastric Bypass as part of her pre-operative nutrition education.    This is visit 2 of 3 as required by patient's insurance provider.    In today's appointment, she brought the patient guide and completed the assigned readings and questions.       Anthropometrics:  Ht: 65   inches  Wt: 296.8  lbs today (07/09/23)        298.25  lbs at consult on 05/23/2023  Body mass index is 49.39 kg/m    Last Weight Metrics:      07/09/2023     8:32 AM 06/09/2023     1:00 PM 06/02/2023     9:48 AM   Weight Loss Metrics   Height 5\' 5"  5\' 5"  5\' 5"    Weight - Scale 296 lbs 13 oz 295 lbs 298 lbs   BMI (Calculated) 49.5 kg/m2 49.2 kg/m2 49.7 kg/m2       Past Medical History includes:  Past Medical History:   Diagnosis Date    Arthritis     GERD (gastroesophageal reflux disease)        Current Medications:  No current outpatient medications on file.     No current facility-administered medications for this encounter.        07/09/23 0845   Diet History   Do you skip meals? Yes   Which Meal/s Do You Skip Breakfast;Lunch  Pt works 3rd shift (7pm-7am) and doesn't eat while at work.    What do you drink on a daily basis and how much? Water   Amount of water drank (oz) >64   Do you read or use nutrition labels as a dietary guide Yes  Looking at kcal and added sugar   Do you avoid eating certain foods or have food allergies?  No   Food record completed? Yes   Who does the grocery shopping? Self   Who cooks your meals Self   Who do you live with? With spouse/partner and children   How many times per week do you eat foods from outside the home 1-2   Physical Activity   Are you physically active? Yes   What type of activity do you engage in? Walking   How many days are you active per week ? 2-4   How many minutes of activity per day? 30-45           Pre- Operative Nutrition Goals Check List Not  Started Started  Progressing Complete   Keep a detailed daily food diary - Bring to every appointment (minimum of 15 days documented)  x      Eat within 1 -1.5 hours of waking   x    Do not skip meals - goal of 3 meals/day spaced 4-6 hours apart, limit snacks and avoid grazing between meals x      Incorporate more lean protein - aim to eat protein first at meals  (Goal: 60-80 grams/day)   x     Aim for  plate non starchy vegetables  x      Measure starches (e.g. corn, peas, potatoes, pasta, rice); 1/3 to 1/2 cups per serving x      Sample protein supplements   (less than 3 grams of fat and added sugar, and at least 20 g protein)  x     Drink 64 ounces of fluid per day (  without caffeine, carbonation, and added sugar)    x   No alcohol for 3 months prior to surgery;  no alcohol for 1 year after surgery    x   Start cutting back on caffeine - must be caffeine-free 1 month prior to surgery   x    Read food labels for the 3 grams or less of fat and added sugar   x     Limit restaurant food  (Goal is less than 1-2 times per week) ; follow nutrition guidelines to select healthier restaurant options  x     Eliminate fried foods (fried in added fat)  x     Eliminate refined carbohydrates (chips, cookies, or any foods outside of the 3 gram rule)  x     Follow the 30-30 rule, do not drink fluid while eating or 30 minutes before/after meals  x     Chew, chew, chew - chew 25 times per bite x      Slow down your meal time - should take 30 minutes to finish 1 meal x      Exercise for 30 minutes per day or a total of 150 minutes/week  x       Food logs were provided by Jacklyn Shell and reviewed during today's appointment.   She currently eats 2 meals and 1-2 snacks a day. Below is an example of a typical day:    Breakfast: Fresh Udon noodles, 6 oz  Lunch: Skip  Dinner: Thai red curry with chicken (1.5 cups)  Snacks: pepperoni  Beverages: See table above    Food logs were uploaded into the electronic health record (EHR).    Areas of demonstrated  improvement include eating within 2 hours of waking up, drinking adequate amounts of fluid, and decreasing caffeine intake.  Areas that will require further attention to meet nutrition recommendations include: not skipping meals, incorporating more lean protein, reading food labels for the 3 gram rule, eliminating fried foods, eliminating refined CHO, following the 30-30 rule, chewing each bite 25x, slowing down during meal times, measuring starches and limiting them to 1/2 cup servings, increasing non-starchy vegetables, and increasing physical activity.    After reviewing Patricia Oneill's food logs and identifying areas for improvement, reinforced preoperative nutrition goals and discussed the contents of the Bariatric Patient Guide. Provided education and application of the key nutrition principles following weight loss surgery to include but not limited to the 3 gram rule for added sugar and total fat, fluid and protein requirements, and portion control of recommended food choices.  Discussed typical post-operative year diet progression including bariatric clear liquids, bariatric full liquids, and bariatric soft diet. Food models and the bariatric plate were utilized to help visualize recommended food choices and portion sizes as appropriate. Additionally, conducted a brief overview of selection criteria for acceptable protein supplements.    Patricia Oneill desires to make the necessary lifestyle changes to lose weight and improve her overall health.  Together we set several goals and these are attached for reference.      Patricia Oneill has my contact information for questions as needed. She is scheduled for a follow-up appointment on September 19th.     Patricia Oneill, RDN  Registered Dietitian Nutritionist  [facility]  [address], [place], [place], [place] [address]  [phone]    Nutrition Goals:  Keep a daily food diary - Bring to every appointment  Do not skip  meals - goal of 3  meals/day, limit snacking between meals.  Eat within 1 -1.5 hours of waking.   Recommend using the healthy snack list or acceptable protein supplement as an option for a meal instead of skipping a meal.  Eat every 4-6 hours.  Use the plate method diagram (picture of plate with servings from each food group) and/or meal plan provided by your Dietitian.  Incorporate more lean protein - eat protein first at meals.  Goal of 60-75 grams (g) protein per day.   of your plate should be non-starchy vegetables (refer to vegetable list).  Limit starches for each meal to 1/3-1/2 cup (e.g. corn, peas, potatoes, pasta, rice, beans).  Reduce and work towards eliminating fried foods.  Reduce and work towards eliminating refined carbohydrates (chips, cookies - no snacks or foods outside of label reading guidelines for grams of sugar and fat).  Fluids:  Drink 64 oz. of fluid per day; water is highly recommended (avoid carbonation (bubbles) and sugar sweetened beverages).  Start cutting back on caffeine - goal is zero caffeine by your last appointment.  Do not use straws.  Read food labels:  Before surgery keep added sugar to 9 g or less for yogurt, 5 g or less for cereals, and 4 g or less for jelly and jam.  After surgery keep added sugar and total fat to 3 g or less (3 gram rule).  Eating practices:  Start sampling protein supplements (supplement should contain less than 3 g of fat and sugar and provide 20 g or more of protein per 8-12 oz. serving).  Refer to acceptable protein supplement list.  Start practicing the 30-30 rule; do not drink fluids 30 minutes before or after meals.  Chew, chew, chew - chew each bite of food 25 - 30 times.  Eat slowly; one meal should take 30 minutes to finish.  Lifestyle behaviors:  Tobacco cessation 3 months prior to surgery- NO SMOKING EVER  Abstain from alcohol for 3 months prior to surgery - no alcohol for one year after surgery.  Exercise a minimum 150 minutes per week (30  minutes per day, 5 days per week) - ultimate goal is to exercise 7 days per week.  Aim to get 7-8 hours of sleep nightly.  Implement ways to manage stress.  Find vitamin and mineral supplements that work for you and purchase PRIOR TO surgery.   Start the Pre-Operative Full Liquid Diet in the day leading up to surgery.    Call my Registered Dietitian Patricia Oneill, RDN, at [phone] if I have any questions.

## 2023-08-14 ENCOUNTER — Inpatient Hospital Stay: Admit: 2023-08-14 | Payer: BLUE CROSS/BLUE SHIELD

## 2023-08-14 NOTE — Discharge Instructions (Signed)
Nutrition Goals:  Keep a daily food diary - Bring to every appointment  Do not skip meals - goal of 3 meals/day, limit snacking between meals.  Eat within 1 -1.5 hours of waking.   Recommend using the healthy snack list or acceptable protein supplement as an option for a meal instead of skipping a meal.  Eat every 4-6 hours.  Use the plate method diagram (picture of plate with servings from each food group) and/or meal plan provided by your Dietitian.  Incorporate more lean protein - eat protein first at meals.  Goal of 60-75 grams (g) protein per day.   of your plate should be non-starchy vegetables (refer to vegetable list).  Limit starches for each meal to 1/3-1/2 cup (e.g. corn, peas, potatoes, pasta, rice, beans).  Reduce and work towards eliminating fried foods.  Reduce and work towards eliminating refined carbohydrates (chips, cookies - no snacks or foods outside of label reading guidelines for grams of sugar and fat).  Fluids:  Drink 64 oz. of fluid per day; water is highly recommended (avoid carbonation (bubbles) and sugar sweetened beverages).  Start cutting back on caffeine - goal is zero caffeine by your last appointment.  Do not use straws.  Read food labels:  Before surgery keep added sugar to 9 g or less for yogurt, 5 g or less for cereals, and 4 g or less for jelly and jam.  After surgery keep added sugar and total fat to 3 g or less (3 gram rule).  Eating practices:  Start sampling protein supplements (supplement should contain less than 3 g of fat and sugar and provide 20 g or more of protein per 8-12 oz. serving).  Refer to acceptable protein supplement list.  Start practicing the 30-30 rule; do not drink fluids 30 minutes before or after meals.  Chew, chew, chew - chew each bite of food 25 - 30 times.  Eat slowly; one meal should take 30 minutes to finish.  Lifestyle behaviors:  Tobacco cessation 3 months prior to surgery- NO SMOKING EVER  Abstain from alcohol for 3 months prior to surgery - no  alcohol for one year after surgery.  Exercise a minimum 150 minutes per week (30 minutes per day, 5 days per week) - ultimate goal is to exercise 7 days per week.  Aim to get 7-8 hours of sleep nightly.  Implement ways to manage stress.  Find vitamin and mineral supplements that work for you and purchase PRIOR TO surgery.   Start the Pre-Operative Full Liquid Diet in the day leading up to surgery.    Call my Registered Dietitian Vista Deck, RDN, at [phone] if I have any questions.

## 2023-08-14 NOTE — Progress Notes (Signed)
 Dear Dr. Nanci:    Patricia Oneill is a 32 y.o. female seen today for nutrition counseling and Medical Nutrition Therapy for Roux-en-Y Gastric Bypass as part of her pre-operative nutrition education.    This is visit 3 of 4 as required by patient's insurance provider.    In today's appointment, she brought the patient guide and completed the assigned readings and questions.       Anthropometrics:  Ht: 65   inches  Wt: 300  lbs today        298.25  lbs at consult on 05/23/2023  Body mass index is 49.39 kg/m    Last Weight Metrics:      08/14/2023     8:32 AM 07/09/2023     8:32 AM 06/09/2023     1:00 PM 06/02/2023     9:48 AM   Weight Loss Metrics   Height 5' 5 5' 5 5' 5 5' 5   Weight - Scale 300 lbs 296 lbs 13 oz 295 lbs 298 lbs   BMI (Calculated) 50 kg/m2 49.5 kg/m2 49.2 kg/m2 49.7 kg/m2       Past Medical History includes:  Past Medical History:   Diagnosis Date    Arthritis     GERD (gastroesophageal reflux disease)        Current Medications:  No current outpatient medications on file.     No current facility-administered medications for this encounter.            08/14/23 1103   Diet History   Do you skip meals? No   What do you drink on a daily basis and how much? Water   Amount of water drank (oz) >64   Do you read or use nutrition labels as a dietary guide Yes   Food record completed? Yes   Who does the grocery shopping? Self   Who cooks your meals Self   Who do you live with? With spouse/partner and children   How many times per week do you eat foods from outside the home 1-2   Physical Activity   Are you physically active? Yes   What type of activity do you engage in? Walking   How many days are you active per week ? 3-4   How many minutes of activity per day? 30-60           Pre- Operative Nutrition Goals Check List Not  Started Started Progressing Complete   Keep a detailed daily food diary - Bring to every appointment (minimum of 15 days documented)    x    Eat within 1 -1.5 hours of waking    x   Do not  skip meals - goal of 3 meals/day spaced 4-6 hours apart, limit snacks and avoid grazing between meals   x    Incorporate more lean protein - aim to eat protein first at meals  (Goal: 60-80 grams/day)    x    Aim for  plate non starchy vegetables    x    Measure starches (e.g. corn, peas, potatoes, pasta, rice); 1/3 to 1/2 cups per serving   x    Sample protein supplements   (less than 3 grams of fat and added sugar, and at least 20 g protein)    x   Drink 64 ounces of fluid per day (without caffeine, carbonation, and added sugar)    x   No alcohol for 3 months prior to surgery;  no alcohol for 1 year  after surgery    x   Start cutting back on caffeine - must be caffeine-free 1 month prior to surgery    x   Read food labels for the 3 grams or less of fat and added sugar    x    Limit restaurant food  (Goal is less than 1-2 times per week) ; follow nutrition guidelines to select healthier restaurant options   x    Eliminate fried foods (fried in added fat)    x   Eliminate refined carbohydrates (chips, cookies, or any foods outside of the 3 gram rule)   x    Follow the 30-30 rule, do not drink fluid while eating or 30 minutes before/after meals   x    Chew, chew, chew - chew 25 times per bite   x    Slow down your meal time - should take 30 minutes to finish 1 meal   x    Exercise for 30 minutes per day or a total of 150 minutes/week   x      Food logs were provided by Patricia Oneill and reviewed during today's appointment.   She currently eats 3 meals and 1-3 snacks a day. Below is an example of a typical day:    Breakfast: chicken breakfast links, 2 slices of toast, and stir-fr sauce  Lunch: 2 cups green beans, 3 oz ground turkey, and 0.5 cup rice  Dinner: 2 cups green beans, 3 oz ground turkey, and 0.5 cup rice  Snacks: 0.5 cups ricotta cheese, 4 oz smoked turkey breast, and sliced cheese  Beverages: See table above    Areas of demonstrated improvement include eating 3 meals per day, incorporating lean protein,  measuring starches, sampling protein supplements, limiting restaurant food, following the 30-30 rule, chewing each bite 25x, and increasing exercise.      After reviewing Patricia Oneill's food logs and identifying continued areas for improvement we reviewed vitamin, mineral and protein supplement requirements as well as key diet principles for the patient to continue working on. Once again discussed typical post-op diet progression including a list of soft moist protein foods permitted on a stage III bariatric diet. Reinforced the importance of adequate protein and fluid intakes post-operatively, provided her with parameters for each.    Will defer to the Surgeon if a liver reduction diet (LRD) is medically necessary due to patient's BMI of 49.92 kg/m2.  Patricia Oneill desires to make the necessary lifestyle changes to lose weight and improve her overall health.  Together we set several goals and these are attached for reference.      Patricia Oneill has my contact information for questions as needed. She is scheduled for a follow-up appointment on October 8.     ALAN JONELLE PINAL, RDN  Registered Dietitian Nutritionist  [facility]  [address] [place], [place], [place] [address]  [phone]    Nutrition Goals:  Keep a daily food diary - Bring to every appointment  Do not skip meals - goal of 3 meals/day, limit snacking between meals.  Eat within 1 -1.5 hours of waking.   Recommend using the healthy snack list or acceptable protein supplement as an option for a meal instead of skipping a meal.  Eat every 4-6 hours.  Use the plate method diagram (picture of plate with servings from each food group) and/or meal plan provided by your Dietitian.  Incorporate more lean protein - eat protein first at meals.  Goal of  60-75 grams (g) protein per day.   of your plate should be non-starchy vegetables (refer to vegetable list).  Limit starches for each meal to 1/3-1/2 cup (e.g. corn,  peas, potatoes, pasta, rice, beans).  Reduce and work towards eliminating fried foods.  Reduce and work towards eliminating refined carbohydrates (chips, cookies - no snacks or foods outside of label reading guidelines for grams of sugar and fat).  Fluids:  Drink 64 oz. of fluid per day; water is highly recommended (avoid carbonation (bubbles) and sugar sweetened beverages).  Start cutting back on caffeine - goal is zero caffeine by your last appointment.  Do not use straws.  Read food labels:  Before surgery keep added sugar to 9 g or less for yogurt, 5 g or less for cereals, and 4 g or less for jelly and jam.  After surgery keep added sugar and total fat to 3 g or less (3 gram rule).  Eating practices:  Start sampling protein supplements (supplement should contain less than 3 g of fat and sugar and provide 20 g or more of protein per 8-12 oz. serving).  Refer to acceptable protein supplement list.  Start practicing the 30-30 rule; do not drink fluids 30 minutes before or after meals.  Chew, chew, chew - chew each bite of food 25 - 30 times.  Eat slowly; one meal should take 30 minutes to finish.  Lifestyle behaviors:  Tobacco cessation 3 months prior to surgery- NO SMOKING EVER  Abstain from alcohol for 3 months prior to surgery - no alcohol for one year after surgery.  Exercise a minimum 150 minutes per week (30 minutes per day, 5 days per week) - ultimate goal is to exercise 7 days per week.  Aim to get 7-8 hours of sleep nightly.  Implement ways to manage stress.  Find vitamin and mineral supplements that work for you and purchase PRIOR TO surgery.   Start the Pre-Operative Full Liquid Diet in the day leading up to surgery.    Call my Registered Dietitian Alla ALAN JONELLE CLEOTILDE, RDN, at [phone] if I have any questions.

## 2023-08-18 ENCOUNTER — Inpatient Hospital Stay: Admit: 2023-08-18 | Discharge: 2023-08-18 | Payer: BLUE CROSS/BLUE SHIELD

## 2023-08-22 LAB — NICOTINE AND METABOLITE, UR QN
Cotinine, Urine: <2 ng/mL
Nicotine Urine: <2 ng/mL

## 2023-09-02 ENCOUNTER — Inpatient Hospital Stay: Admit: 2023-09-02 | Payer: BLUE CROSS/BLUE SHIELD | Attending: Surgery

## 2023-09-02 DIAGNOSIS — Z01818 Encounter for other preprocedural examination: Secondary | ICD-10-CM

## 2023-09-02 MED ORDER — SOD BICARB-CITRIC AC-SIMETH 2.21-1.53-0.04 G PO PACK
Freq: Once | ORAL | Status: AC
Start: 2023-09-02 — End: 2023-09-02
  Administered 2023-09-02: 15:00:00 1 g via ORAL

## 2023-09-02 MED ORDER — BARIUM SULFATE 96 % PO SUSR
96 | Freq: Once | ORAL | Status: AC | PRN
Start: 2023-09-02 — End: 2023-09-02
  Administered 2023-09-02: 15:00:00 176 g via ORAL

## 2023-09-02 MED ORDER — BARIUM SULFATE 98 % PO SUSR
98 | Freq: Once | ORAL | Status: AC | PRN
Start: 2023-09-02 — End: 2023-09-02
  Administered 2023-09-02: 15:00:00 340 g via ORAL

## 2023-09-02 MED FILL — E-Z-HD 98 % PO SUSR: 98 % | ORAL | Qty: 143

## 2023-09-02 MED FILL — E-Z-PAQUE 96 % PO SUSR: 96 % | ORAL | Qty: 750

## 2023-09-02 MED FILL — E-Z-GAS II 2.21-1.53-0.04 G PO PACK: ORAL | Qty: 1

## 2023-09-09 ENCOUNTER — Inpatient Hospital Stay: Admit: 2023-09-09 | Payer: BLUE CROSS/BLUE SHIELD

## 2023-09-09 NOTE — Progress Notes (Signed)
Dear Dr. Lenise Arena:    Patricia Oneill is a 32 y.o. female seen today for nutrition counseling and Medical Nutrition Therapy for Roux-en-Y Gastric Bypass as part of her pre-operative nutrition education.    This is visit 4 of 4 as required by patient's insurance provider.    Patient demonstrates to be a fit candidate for weight loss surgery and will be scheduled for a nutrition follow up one month post-operatively.    In today's appointment, she  brought the patient guide and completed the assigned readings and questions.       Anthropometrics:  Ht: 65   inches  Wt: 301.8  lbs today        298.25  lbs at consult on 05/23/2023  Body mass index is 50.3 kg/m    Last Weight Metrics:      09/09/2023     8:52 AM 08/14/2023     8:32 AM 07/09/2023     8:32 AM 06/09/2023     1:00 PM 06/02/2023     9:48 AM   Weight Loss Metrics   Height 5\' 5"  5\' 5"  5\' 5"  5\' 5"  5\' 5"    Weight - Scale 301 lbs 13 oz 300 lbs 296 lbs 13 oz 295 lbs 298 lbs   BMI (Calculated) 50.3 kg/m2 50 kg/m2 49.5 kg/m2 49.2 kg/m2 49.7 kg/m2       Past Medical History includes:  Past Medical History:   Diagnosis Date    Arthritis     GERD (gastroesophageal reflux disease)        Current Medications:  No current outpatient medications on file.     No current facility-administered medications for this encounter.      09/09/23 0853   Diet History   Do you skip meals? No   What do you drink on a daily basis and how much? Water; Unsweetened drinks (Splenda)   Amount of unsweetened drinks (oz) drank 8 (1 Cup)  (Lemonade Crystal Light)   Amount of water drank (oz) 64 (8 Cups)   Do you read or use nutrition labels as a dietary guide Yes   Weight history in the last three months? Stable   Food record completed? Yes   Who does the grocery shopping? Self   Who cooks your meals Self   Who do you live with? With spouse/partner and children   How many times per week do you eat foods from outside the home 1-2   Physical Activity   Are you physically active? Yes   What type of activity do  you engage in? Walking  (Recently reached out to a Systems analyst)   How many days are you active per week ? 2-4   How many minutes of activity per day? 45-60 minutes       Pre- Operative Nutrition Goals Check List Not  Started Started Progressing Complete   Keep a detailed daily food diary - Bring to every appointment (minimum of 15 days documented)    x    Eat within 1 -1.5 hours of waking    x   Do not skip meals - goal of 3 meals/day spaced 4-6 hours apart, limit snacks and avoid grazing between meals    x   Incorporate more lean protein - aim to eat protein first at meals  (Goal: 60-80 grams/day)     x   Aim for  plate non starchy vegetables     x   Measure starches (e.g. corn, peas, potatoes, pasta, rice);  1/3 to 1/2 cups per serving    x   Sample protein supplements   (less than 3 grams of fat and added sugar, and at least 20 g protein)    x   Drink 64 ounces of fluid per day (without caffeine, carbonation, and added sugar)    x   No alcohol for 3 months prior to surgery;  no alcohol for 1 year after surgery    x   Start cutting back on caffeine - must be caffeine-free 1 month prior to surgery    x   Read food labels for the 3 grams or less of fat and added sugar     x   Limit restaurant food  (Goal is less than 1-2 times per week) ; follow nutrition guidelines to select healthier restaurant options    x   Eliminate fried foods (fried in added fat)    x   Eliminate refined carbohydrates (chips, cookies, or any foods outside of the 3 gram rule)    x   Follow the 30-30 rule, do not drink fluid while eating or 30 minutes before/after meals   x    Chew, chew, chew - chew 25 times per bite   x    Slow down your meal time - should take 30 minutes to finish 1 meal   x    Exercise for 30 minutes per day or a total of 150 minutes/week    x     Based on electronic food log app Drema Bloodsaw is eating 3 meals per day, consuming 1600 to 1900 calories and 110 grams or more of protein daily.    Breakfast: Scrambled  eggs, (2 large), Wheat Bread (2), Fat free Mayo ( 2 tsbp) OR Salmon Patty, Cauliflower Rice ( 1 cup), Rice (1/2 cup)  Lunch: Salmon/Chicken, Rice (1/2 cup), Cauliflower rice (1 cup), Steamed Broccoli ( 1 cup)  Dinner: Haematologist (6), Stir [employer], Veggies (1 cup)  Snacks: Fat Free Ricotta Cheese (1/2 cup), Apple   Beverages: Water, Crystal Light    Food logs were uploaded into the electronic health record (EHR).    Areas of demonstrated improvement include keeping detailed food logs, trying protein drink, eating lean protein choices.  Areas that will require further attention to meet nutrition recommendations include taking time to eat her meals when at work.    After reviewing Letti Hostetler's food logs and identifying continued areas for improvement we reviewed vitamin, mineral and protein supplement requirements as well as key diet principles for the patient to continue working on. Once again discussed typical post-op diet progression including a list of soft moist protein foods permitted on a stage III bariatric diet. Reinforced the importance of adequate protein and fluid intakes post-operatively, provided her with parameters for each.    She was provided education and instruction on the pre-operative liver reduction diet (LRD)  to be started 14 days prior to surgery. The LRD low carbohydrate diet is outlined below. Provided handouts including recommended schedule, daily checklist, list of appropriate food/fluids, and list of recommended protein supplements.  Conducted Manufacturing systems engineer to Teacher, adult education.     Breakfast:  -Protein Shake (20-30 g. protein, <=5 g carbohydrates, <=200 kcals)     Snack #1:   -1 cup raw vegetables (non-starchy)    lunch:   -Protein Shake (20-30g. protein, <=5g carbohydrates, <=200 kcals)    Snack #2:   -Choose 1 of the following:     --  2 hardboiled eggs  --2 oz. low-fat cheese  --5 oz. Greek yogurt  -- cup low-fat cottage  cheese    Dinner:  -3-4 oz. lean meat   -1.5 cups cooked vegetables (non-starchy)    Judithann Klunder desires to make the necessary lifestyle changes to lose weight and improve her overall health.  Together we set several goals and these are attached for reference.      Janeese Eckels has my contact information for questions as needed.     Ned Grace, RD   Registered Dietitian Nutritionist  [facility]  [address] [place], [place], [place] [address]  [phone]    Nutrition Goals:  Keep a daily food diary - Bring to every appointment  Do not skip meals - goal of 3 meals/day, limit snacking between meals.  Eat within 1 -1.5 hours of waking.   Recommend using the healthy snack list or acceptable protein supplement as an option for a meal instead of skipping a meal.  Eat every 4-6 hours.  Use the plate method diagram (picture of plate with servings from each food group) and/or meal plan provided by your Dietitian.  Incorporate more lean protein - eat protein first at meals.  Goal of 60-75 grams (g) protein per day.   of your plate should be non-starchy vegetables (refer to vegetable list).  Limit starches for each meal to 1/3-1/2 cup (e.g. corn, peas, potatoes, pasta, rice, beans).  Reduce and work towards eliminating fried foods.  Reduce and work towards eliminating refined carbohydrates (chips, cookies - no snacks or foods outside of label reading guidelines for grams of sugar and fat).  Fluids:  Drink 64 oz. of fluid per day; water is highly recommended (avoid carbonation (bubbles) and sugar sweetened beverages).  Start cutting back on caffeine - goal is zero caffeine by your last appointment.  Do not use straws.  Read food labels:  Before surgery keep added sugar to 9 g or less for yogurt, 5 g or less for cereals, and 4 g or less for jelly and jam.  After surgery keep added sugar and total fat to 3 g or less (3 gram rule).  Eating practices:  Start sampling protein  supplements (supplement should contain less than 3 g of fat and sugar and provide 20 g or more of protein per 8-12 oz. serving).  Refer to acceptable protein supplement list.  Start practicing the 30-30 rule; do not drink fluids 30 minutes before or after meals.  Chew, chew, chew - chew each bite of food 25 - 30 times.  Eat slowly; one meal should take 30 minutes to finish.  Lifestyle behaviors:  Tobacco cessation 3 months prior to surgery- NO SMOKING EVER  Abstain from alcohol for 3 months prior to surgery - no alcohol for one year after surgery.  Exercise a minimum 150 minutes per week (30 minutes per day, 5 days per week) - ultimate goal is to exercise 7 days per week.  Aim to get 7-8 hours of sleep nightly.  Implement ways to manage stress.  Find vitamin and mineral supplements that work for you and purchase PRIOR TO surgery.   Start the Liver Reduction Diet 14 days before surgery date.  Start the Pre-Operative Full Liquid Diet in the day leading up to surgery.    Call my Registered Dietitian Nutritionist, Ned Grace, RD, at [phone] if I have any questions.

## 2023-09-09 NOTE — Discharge Instructions (Signed)
Nutrition Goals:  Keep a daily food diary - Bring to every appointment  Do not skip meals - goal of 3 meals/day, limit snacking between meals.  Eat within 1 -1.5 hours of waking.   Recommend using the healthy snack list or acceptable protein supplement as an option for a meal instead of skipping a meal.  Eat every 4-6 hours.  Use the plate method diagram (picture of plate with servings from each food group) and/or meal plan provided by your Dietitian.  Incorporate more lean protein - eat protein first at meals.  Goal of 60-75 grams (g) protein per day.   of your plate should be non-starchy vegetables (refer to vegetable list).  Limit starches for each meal to 1/3-1/2 cup (e.g. corn, peas, potatoes, pasta, rice, beans).  Reduce and work towards eliminating fried foods.  Reduce and work towards eliminating refined carbohydrates (chips, cookies - no snacks or foods outside of label reading guidelines for grams of sugar and fat).  Fluids:  Drink 64 oz. of fluid per day; water is highly recommended (avoid carbonation (bubbles) and sugar sweetened beverages).  Start cutting back on caffeine - goal is zero caffeine by your last appointment.  Do not use straws.  Read food labels:  Before surgery keep added sugar to 9 g or less for yogurt, 5 g or less for cereals, and 4 g or less for jelly and jam.  After surgery keep added sugar and total fat to 3 g or less (3 gram rule).  Eating practices:  Start sampling protein supplements (supplement should contain less than 3 g of fat and sugar and provide 20 g or more of protein per 8-12 oz. serving).  Refer to acceptable protein supplement list.  Start practicing the 30-30 rule; do not drink fluids 30 minutes before or after meals.  Chew, chew, chew - chew each bite of food 25 - 30 times.  Eat slowly; one meal should take 30 minutes to finish.  Lifestyle behaviors:  Tobacco cessation 3 months prior to surgery- NO SMOKING EVER  Abstain from alcohol for 3 months prior to surgery - no  alcohol for one year after surgery.  Exercise a minimum 150 minutes per week (30 minutes per day, 5 days per week) - ultimate goal is to exercise 7 days per week.  Aim to get 7-8 hours of sleep nightly.  Implement ways to manage stress.  Find vitamin and mineral supplements that work for you and purchase PRIOR TO surgery.   Start the Liver Reduction Diet 14 days before surgery date.  Start the Pre-Operative Full Liquid Diet in the day leading up to surgery.    Call my Registered Dietitian Nutritionist, Ned Grace, RD, at [phone] if I have any questions.

## 2023-10-16 NOTE — Anesthesia Pre-Procedure Evaluation (Addendum)
 Department of Anesthesiology  Preprocedure Note       Name:  Patricia Oneill   Age:  32 y.o.  DOB:  Mar 11, 1991                                          MRN:  [id number]         Date:  10/19/2023      Surgeon: Moishe Spice):  Tivis Ringer, MD    Procedure: Procedur

## 2023-10-17 ENCOUNTER — Ambulatory Visit
Admit: 2023-10-17 | Discharge: 2023-10-18 | Disposition: A | Discharge status: Home / Self Care | Payer: BLUE CROSS/BLUE SHIELD | Attending: Surgery | Admitting: Surgery

## 2023-10-17 VITALS — Ht 65.0 in | Wt 298.0 lb

## 2023-10-17 DIAGNOSIS — Z01812 Encounter for preprocedural laboratory examination: Secondary | ICD-10-CM

## 2023-10-17 LAB — CBC WITH AUTO DIFFERENTIAL
Hematocrit: 38.8 % (ref 35.0–47.0)
Hemoglobin: 11.6 g/dL (ref 11.0–16.0)
Lymphocytes: 28 % (ref 28–48)
MCV: 68.7 fL — ABNORMAL LOW (ref 80.0–98.0)
MPV: 10.6 fL — ABNORMAL HIGH (ref 6.0–10.0)
Neutrophils Segmented: 66 % — ABNORMAL HIGH (ref 34–64)
Nucleated RBCs: 0 (ref 0–0)
Platelet Appearance: NORMAL
Platelets: 379 10*3/uL (ref 140–450)
RBC Morphology: NORMAL
RBC: 5.65 M/uL — ABNORMAL HIGH (ref 3.60–5.20)
RDW: 37.3 (ref 36.4–46.3)
WBC: 10.5 10*3/uL (ref 4.0–11.0)

## 2023-10-17 LAB — COMPREHENSIVE METABOLIC PANEL
ALT: 58 U/L — ABNORMAL HIGH (ref 10–49)
AST: 42 U/L — ABNORMAL HIGH (ref 0.0–33.9)
Albumin: 3.9 g/dL (ref 3.4–5.0)
Alkaline Phosphatase: 66 U/L (ref 46–116)
Anion Gap: 9 mmol/L (ref 5–15)
BUN: 19 mg/dL (ref 9–23)
CO2: 25 meq/L (ref 20–31)
Calcium: 9.5 mg/dL (ref 8.7–10.4)
Chloride: 102 meq/L (ref 98–107)
Creatinine: 0.73 mg/dL (ref 0.55–1.02)
GFR African American: >60
GFR Non-African American: >60
Glucose: 73 mg/dL — ABNORMAL LOW (ref 74–106)
Potassium: 3.4 meq/L — ABNORMAL LOW (ref 3.5–5.1)
Sodium: 136 meq/L (ref 136–145)
Total Bilirubin: 0.7 mg/dL (ref 0.30–1.20)
Total Protein: 8.3 g/dL — ABNORMAL HIGH (ref 5.7–8.2)

## 2023-10-17 LAB — PREGNANCY, URINE: Pregnancy, Urine: NEGATIVE

## 2023-10-17 LAB — HEMOGLOBIN A1C: Hemoglobin A1C: 4.9 % (ref 3.8–5.6)

## 2023-10-17 NOTE — Other (Signed)
 PREOPERATIVE INSTRUCTIONS  Please Read Carefully    [x]  Your procedure is scheduled on: 10/29/2023     [x]  The day before your surgery, call the surgeon's office to check on the surgery time and the time you should arrive.     [x]  On the day of y

## 2023-10-28 NOTE — H&P (Signed)
 Name Patricia Oneill, Patricia Oneill (11BJ, F) ID# [id number] Appt. Date/Time 10/09/2023 11:30AM   DOB 03-18-1991 Service Dept. CSS_Surg _Spec_Office   Provider Jermey Closs Lenise Arena, MD   Insurance Med Primary[place]  Insurance # : [id number]  Policy/Group # : [id number]  Prescri

## 2023-10-29 ENCOUNTER — Inpatient Hospital Stay
Admit: 2023-10-29 | Discharge: 2023-10-30 | Disposition: A | Discharge status: Home / Self Care | Payer: BLUE CROSS/BLUE SHIELD | Attending: Surgery | Admitting: Surgery

## 2023-10-29 LAB — POC PREGNANCY UR-QUAL: Pregnancy, Urine: NEGATIVE

## 2023-10-29 MED ORDER — KETOROLAC TROMETHAMINE 15 MG/ML IJ SOLN
15 | Freq: Four times a day (QID) | INTRAMUSCULAR | Status: DC
Start: 2023-10-29 — End: 2023-10-30
  Administered 2023-10-30 (×3): 15 mg via INTRAVENOUS

## 2023-10-29 MED ORDER — LIDOCAINE HCL (PF) 1 % IJ SOLN
1 | Freq: Once | INTRAMUSCULAR | Status: DC | PRN
Start: 2023-10-29 — End: 2023-10-29

## 2023-10-29 MED ORDER — FENTANYL CITRATE (PF) 100 MCG/2ML IJ SOLN
100 | Freq: Once | INTRAMUSCULAR | Status: DC | PRN
Start: 2023-10-29 — End: 2023-10-29
  Administered 2023-10-29 (×2): 50 via INTRAVENOUS

## 2023-10-29 MED ORDER — DEXAMETHASONE SODIUM PHOSPHATE 20 MG/5ML IJ SOLN
20 | INTRAMUSCULAR | Status: AC
Start: 2023-10-29 — End: ?

## 2023-10-29 MED ORDER — PHENYLEPHRINE HCL 10 MG/ML SOLN (MIXTURES ONLY)
10 | Freq: Once | Status: DC | PRN
Start: 2023-10-29 — End: 2023-10-29
  Administered 2023-10-29: 15:00:00 150 via INTRAVENOUS
  Administered 2023-10-29 (×2): 100 via INTRAVENOUS

## 2023-10-29 MED ORDER — ENOXAPARIN SODIUM 40 MG/0.4ML IJ SOSY
40 | Freq: Every day | INTRAMUSCULAR | Status: DC
Start: 2023-10-29 — End: 2023-10-30
  Administered 2023-10-30: 14:00:00 40 mg via SUBCUTANEOUS

## 2023-10-29 MED ORDER — LACTATED RINGERS IV SOLN
INTRAVENOUS | Status: DC | PRN
Start: 2023-10-29 — End: 2023-10-29
  Administered 2023-10-29: 14:00:00 via INTRAVENOUS

## 2023-10-29 MED ORDER — ONDANSETRON HCL 4 MG/2ML IJ SOLN
4 | Freq: Once | INTRAMUSCULAR | Status: DC | PRN
Start: 2023-10-29 — End: 2023-10-29
  Administered 2023-10-29: 16:00:00 4 via INTRAVENOUS

## 2023-10-29 MED ORDER — GLYCOPYRROLATE 0.2 MG/ML IJ SOLN
0.2 | INTRAMUSCULAR | Status: AC
Start: 2023-10-29 — End: ?

## 2023-10-29 MED ORDER — FENTANYL CITRATE (PF) 100 MCG/2ML IJ SOLN
100 | INTRAMUSCULAR | Status: AC
Start: 2023-10-29 — End: ?

## 2023-10-29 MED ORDER — HYDRALAZINE HCL 20 MG/ML IJ SOLN
20 | INTRAMUSCULAR | Status: DC | PRN
Start: 2023-10-29 — End: 2023-10-30

## 2023-10-29 MED ORDER — HYDROMORPHONE HCL 1 MG/ML IJ SOLN
1 | INTRAMUSCULAR | Status: DC | PRN
Start: 2023-10-29 — End: 2023-10-29

## 2023-10-29 MED ORDER — SUGAMMADEX SODIUM 200 MG/2ML IV SOLN
200 | Freq: Once | INTRAVENOUS | Status: DC | PRN
Start: 2023-10-29 — End: 2023-10-29
  Administered 2023-10-29: 16:00:00 200 via INTRAVENOUS

## 2023-10-29 MED ORDER — DEXMEDETOMIDINE HCL 200 MCG/2ML IV SOLN
200 | Freq: Once | INTRAVENOUS | Status: DC | PRN
Start: 2023-10-29 — End: 2023-10-29
  Administered 2023-10-29: 16:00:00 20 via INTRAVENOUS

## 2023-10-29 MED ORDER — ACETAMINOPHEN 10 MG/ML IV SOLN
10 | Freq: Once | INTRAVENOUS | Status: DC | PRN
Start: 2023-10-29 — End: 2023-10-29
  Administered 2023-10-29: 16:00:00 1000 via INTRAVENOUS

## 2023-10-29 MED ORDER — ACETAMINOPHEN 10 MG/ML IV SOLN
10 | Freq: Four times a day (QID) | INTRAVENOUS | Status: AC | PRN
Start: 2023-10-29 — End: 2023-10-30
  Administered 2023-10-29 – 2023-10-30 (×2): 1000 mg via INTRAVENOUS

## 2023-10-29 MED ORDER — POTASSIUM CHLORIDE IN NACL 20-0.9 MEQ/L-% IV SOLN
INTRAVENOUS | Status: DC
Start: 2023-10-29 — End: 2023-10-30
  Administered 2023-10-29 – 2023-10-30 (×2): via INTRAVENOUS

## 2023-10-29 MED ORDER — LIDOCAINE HCL 1 % IJ SOLN
1 | INTRAMUSCULAR | Status: AC
Start: 2023-10-29 — End: ?

## 2023-10-29 MED ORDER — MIDAZOLAM HCL 2 MG/2ML IJ SOLN
2 | Freq: Once | INTRAMUSCULAR | Status: DC | PRN
Start: 2023-10-29 — End: 2023-10-29
  Administered 2023-10-29: 14:00:00 2 via INTRAVENOUS

## 2023-10-29 MED ORDER — ONDANSETRON 4 MG PO TBDP
4 | Freq: Three times a day (TID) | ORAL | Status: DC | PRN
Start: 2023-10-29 — End: 2023-10-30

## 2023-10-29 MED ORDER — NORMAL SALINE FLUSH 0.9 % IV SOLN
0.9 | INTRAVENOUS | Status: DC | PRN
Start: 2023-10-29 — End: 2023-10-29

## 2023-10-29 MED ORDER — MAGNESIUM SULFATE IN D5W 1-5 GM/100ML-% IV SOLN
1-5 | Freq: Once | INTRAVENOUS | Status: DC | PRN
Start: 2023-10-29 — End: 2023-10-29
  Administered 2023-10-29: 14:00:00 1000 via INTRAVENOUS

## 2023-10-29 MED ORDER — NORMAL SALINE FLUSH 0.9 % IV SOLN
0.9 | INTRAVENOUS | Status: DC | PRN
Start: 2023-10-29 — End: 2023-10-30

## 2023-10-29 MED ORDER — LIDOCAINE HCL 1 % IJ SOLN
1 | Freq: Once | INTRAMUSCULAR | Status: DC | PRN
Start: 2023-10-29 — End: 2023-10-29
  Administered 2023-10-29: 14:00:00 50 via INTRAVENOUS

## 2023-10-29 MED ORDER — PROPOFOL 200 MG/20ML IV EMUL
200 | INTRAVENOUS | Status: AC
Start: 2023-10-29 — End: ?

## 2023-10-29 MED ORDER — TRANEXAMIC ACID 1000 MG/10ML IV SOLN
1000 | INTRAVENOUS | Status: DC | PRN
Start: 2023-10-29 — End: 2023-10-29
  Administered 2023-10-29: 14:00:00 1000 via INTRAVENOUS

## 2023-10-29 MED ORDER — GLYCOPYRROLATE 0.2 MG/ML IJ SOLN
0.2 | Freq: Once | INTRAMUSCULAR | Status: DC | PRN
Start: 2023-10-29 — End: 2023-10-29
  Administered 2023-10-29: 14:00:00 .2 via INTRAVENOUS

## 2023-10-29 MED ORDER — SUCCINYLCHOLINE CHLORIDE 20 MG/ML IJ SOLN
20 | INTRAMUSCULAR | Status: AC
Start: 2023-10-29 — End: ?

## 2023-10-29 MED ORDER — SODIUM CHLORIDE 0.9 % IV SOLN
0.9 | INTRAVENOUS | Status: DC | PRN
Start: 2023-10-29 — End: 2023-10-30

## 2023-10-29 MED ORDER — SODIUM CHLORIDE (PF) 0.9 % IJ SOLN
0.9 | Freq: Every day | INTRAMUSCULAR | Status: DC
Start: 2023-10-29 — End: 2023-10-30
  Administered 2023-10-29 – 2023-10-30 (×2): 40 mg via INTRAVENOUS

## 2023-10-29 MED ORDER — ROCURONIUM BROMIDE 50 MG/5ML IV SOLN
50 | INTRAVENOUS | Status: AC
Start: 2023-10-29 — End: ?

## 2023-10-29 MED ORDER — HEPARIN SODIUM (PORCINE) 5000 UNIT/ML IJ SOLN
5000 | INTRAMUSCULAR | Status: AC
Start: 2023-10-29 — End: 2023-10-29
  Administered 2023-10-29: 14:00:00 5000 [IU] via SUBCUTANEOUS

## 2023-10-29 MED ORDER — HYDROMORPHONE HCL 1 MG/ML IJ SOLN
1 | INTRAMUSCULAR | Status: DC | PRN
Start: 2023-10-29 — End: 2023-10-30
  Administered 2023-10-29: 19:00:00 0.4 mg via INTRAVENOUS

## 2023-10-29 MED ORDER — SCOPOLAMINE 1 MG/3DAYS TD PT72
1 | TRANSDERMAL | Status: DC
Start: 2023-10-29 — End: 2023-10-30
  Administered 2023-10-29: 14:00:00 1 via TRANSDERMAL

## 2023-10-29 MED ORDER — CEFAZOLIN 3000 MG IN NS 100 ML IVPB
Status: AC
Start: 2023-10-29 — End: 2023-10-29
  Administered 2023-10-29: 14:00:00 3000 mg via INTRAVENOUS

## 2023-10-29 MED ORDER — LACTATED RINGERS IV SOLN
INTRAVENOUS | Status: DC
Start: 2023-10-29 — End: 2023-10-29

## 2023-10-29 MED ORDER — DEXAMETHASONE SODIUM PHOSPHATE 20 MG/5ML IJ SOLN
20 | Freq: Once | INTRAMUSCULAR | Status: DC | PRN
Start: 2023-10-29 — End: 2023-10-29
  Administered 2023-10-29: 14:00:00 10 via INTRAVENOUS

## 2023-10-29 MED ORDER — ROCURONIUM BROMIDE 50 MG/5ML IV SOLN
50 | Freq: Once | INTRAVENOUS | Status: DC | PRN
Start: 2023-10-29 — End: 2023-10-29
  Administered 2023-10-29 (×2): 50 via INTRAVENOUS
  Administered 2023-10-29: 15:00:00 20 via INTRAVENOUS

## 2023-10-29 MED ORDER — HYDROMORPHONE HCL 1 MG/ML IJ SOLN
1 | INTRAMUSCULAR | Status: DC | PRN
Start: 2023-10-29 — End: 2023-10-30

## 2023-10-29 MED ORDER — LORAZEPAM 2 MG/ML IJ SOLN
2 | Freq: Four times a day (QID) | INTRAMUSCULAR | Status: DC | PRN
Start: 2023-10-29 — End: 2023-10-30

## 2023-10-29 MED ORDER — KETOROLAC TROMETHAMINE 30 MG/ML IJ SOLN
30 | Freq: Once | INTRAMUSCULAR | Status: DC | PRN
Start: 2023-10-29 — End: 2023-10-29
  Administered 2023-10-29: 16:00:00 30 via INTRAVENOUS

## 2023-10-29 MED ORDER — BUPIVACAINE HCL (PF) 0.25 % IJ SOLN
0.25 | INTRAMUSCULAR | Status: DC | PRN
Start: 2023-10-29 — End: 2023-10-29
  Administered 2023-10-29: 14:00:00 30 via SUBCUTANEOUS

## 2023-10-29 MED ORDER — BUPIVACAINE HCL (PF) 0.25 % IJ SOLN
0.25 | INTRAMUSCULAR | Status: AC
Start: 2023-10-29 — End: ?

## 2023-10-29 MED ORDER — ONDANSETRON HCL 4 MG/2ML IJ SOLN
4 | Freq: Four times a day (QID) | INTRAMUSCULAR | Status: DC | PRN
Start: 2023-10-29 — End: 2023-10-30

## 2023-10-29 MED ORDER — NORMAL SALINE FLUSH 0.9 % IV SOLN
0.9 | Freq: Two times a day (BID) | INTRAVENOUS | Status: DC
Start: 2023-10-29 — End: 2023-10-30
  Administered 2023-10-30 (×2): 10 mL via INTRAVENOUS

## 2023-10-29 MED ORDER — MIDAZOLAM HCL 2 MG/2ML IJ SOLN
2 | INTRAMUSCULAR | Status: AC
Start: 2023-10-29 — End: ?

## 2023-10-29 MED ORDER — SODIUM CHLORIDE 0.9 % IV SOLN
0.9 | INTRAVENOUS | Status: DC | PRN
Start: 2023-10-29 — End: 2023-10-29

## 2023-10-29 MED ORDER — GABAPENTIN 250 MG/5ML PO SOLN
250 | Freq: Three times a day (TID) | ORAL | Status: DC
Start: 2023-10-29 — End: 2023-10-30
  Administered 2023-10-30: 11:00:00 300 mg via ORAL

## 2023-10-29 MED ORDER — NORMAL SALINE FLUSH 0.9 % IV SOLN
0.9 | Freq: Two times a day (BID) | INTRAVENOUS | Status: DC
Start: 2023-10-29 — End: 2023-10-29

## 2023-10-29 MED ORDER — PROPOFOL 200 MG/20ML IV EMUL
200 | Freq: Once | INTRAVENOUS | Status: DC | PRN
Start: 2023-10-29 — End: 2023-10-29
  Administered 2023-10-29: 14:00:00 200 via INTRAVENOUS

## 2023-10-29 MED FILL — DEXAMETHASONE SODIUM PHOSPHATE 20 MG/5ML IJ SOLN: 20 MG/5ML | INTRAMUSCULAR | Qty: 5

## 2023-10-29 MED FILL — HYDROMORPHONE HCL 1 MG/ML IJ SOLN: 1 MG/ML | INTRAMUSCULAR | Qty: 1

## 2023-10-29 MED FILL — POTASSIUM CHLORIDE IN NACL 20-0.9 MEQ/L-% IV SOLN: INTRAVENOUS | Qty: 1000

## 2023-10-29 MED FILL — PANTOPRAZOLE SODIUM 40 MG IV SOLR: 40 MG | INTRAVENOUS | Qty: 40

## 2023-10-29 MED FILL — CEFAZOLIN 3000 MG IN NS 100 ML IVPB: Qty: 100

## 2023-10-29 MED FILL — SUCCINYLCHOLINE CHLORIDE 20 MG/ML IJ SOLN: 20 MG/ML | INTRAMUSCULAR | Qty: 10

## 2023-10-29 MED FILL — KETOROLAC TROMETHAMINE 15 MG/ML IJ SOLN: 15 MG/ML | INTRAMUSCULAR | Qty: 1

## 2023-10-29 MED FILL — ACETAMINOPHEN 10 MG/ML IV SOLN: 10 MG/ML | INTRAVENOUS | Qty: 100

## 2023-10-29 MED FILL — LACTATED RINGERS IV SOLN: INTRAVENOUS | Qty: 1000

## 2023-10-29 MED FILL — DIPRIVAN 200 MG/20ML IV EMUL: 200 MG/20ML | INTRAVENOUS | Qty: 20

## 2023-10-29 MED FILL — ROCURONIUM BROMIDE 50 MG/5ML IV SOLN: 50 MG/5ML | INTRAVENOUS | Qty: 5

## 2023-10-29 MED FILL — MIDAZOLAM HCL 2 MG/2ML IJ SOLN: 2 MG/ML | INTRAMUSCULAR | Qty: 2

## 2023-10-29 MED FILL — FENTANYL CITRATE (PF) 100 MCG/2ML IJ SOLN: 100 MCG/2ML | INTRAMUSCULAR | Qty: 2

## 2023-10-29 MED FILL — GLYCOPYRROLATE 0.2 MG/ML IJ SOLN: 0.2 MG/ML | INTRAMUSCULAR | Qty: 1

## 2023-10-29 MED FILL — LIDOCAINE HCL 1 % IJ SOLN: 1 % | INTRAMUSCULAR | Qty: 20

## 2023-10-29 MED FILL — BUPIVACAINE HCL (PF) 0.25 % IJ SOLN: 0.25 % | INTRAMUSCULAR | Qty: 30

## 2023-10-29 MED FILL — HEPARIN SODIUM (PORCINE) 5000 UNIT/ML IJ SOLN: 5000 UNIT/ML | INTRAMUSCULAR | Qty: 1

## 2023-10-29 NOTE — Progress Notes (Signed)
 TRANSFER - OUT REPORT:    Verbal report given to Rebekka, RN on [employer]  being transferred to 2107 for routine progression of patient care       Report consisted of patient's Situation, Background, Assessment and   Recommendations(SBAR).     Informat

## 2023-10-29 NOTE — Other (Signed)
 10/29/23 1138   Family Communication   Contact Person Relationship to Patient Spouse   Contact Person Phone Number Worthy Keeler  (at work)   Family/Significant Other Update Updated;Other (comment)   Delivery Origin Nurse   Message Disposition Other (c

## 2023-10-29 NOTE — Progress Notes (Signed)
 Driver [employer] [phone]

## 2023-10-29 NOTE — Op Note (Signed)
 [facility]  [phone]    Operative Note    Patient: Patricia Oneill Age: 32 y.o. Sex: female    Date of Birth: 1991-11-19 Admit Date: 10/29/2023 PCP: None, None   MRN: [id number]  CSN: [id number]       DATE OF SURGERY:   10/29/2023    PREOP

## 2023-10-29 NOTE — Interval H&P Note (Signed)
 The patient's History and Physical was reviewed with the patient and I examined the patient. There was no change. The surgical site was confirmed by the patient and me. Plan: The risks, benefits, expected outcome, and alternative to the recommended procedu

## 2023-10-29 NOTE — Discharge Instructions (Signed)
 Discharge Instructions following Roux En Y Gastric Bypass    Medications:    Begin recommended vitamins, supplements, and all other home medications as instructed.    All medications should be crushed, powdered, liquid, placed under the tongue, or chewa

## 2023-10-29 NOTE — Plan of Care (Signed)
 Problem: Pain  Goal: Verbalizes/displays adequate comfort level or baseline comfort level  10/29/2023 2348 by Galvin Proffer, RN  Outcome: Progressing  10/29/2023 1448 by Aundria Rud, RN  Outcome: Progressing     Problem: ABCDS Injury Assessment

## 2023-10-29 NOTE — Anesthesia Post-Procedure Evaluation (Signed)
 Department of Anesthesiology  Postprocedure Note    Patient: Patricia Oneill  MRN: [id number]  Birthdate: 11-17-1991  Date of evaluation: 10/29/2023    Procedure Summary       Date: 10/29/23 Room / Location: [facility] MAIN 03 / [facility] MAIN OR    Anesthesia Start: [phone] [facility]

## 2023-10-30 LAB — CBC
Hematocrit: 33.1 % — ABNORMAL LOW (ref 35.0–47.0)
Hemoglobin: 10.1 g/dL — ABNORMAL LOW (ref 11.0–16.0)
MCH: 21 pg — ABNORMAL LOW (ref 25.4–34.6)
MCHC: 30.5 g/dL (ref 30.0–36.0)
MCV: 69 fL — ABNORMAL LOW (ref 80.0–98.0)
MPV: 11 fL — ABNORMAL HIGH (ref 6.0–10.0)
Platelets: 327 10*3/uL (ref 140–450)
RBC: 4.8 M/uL (ref 3.60–5.20)
RDW: 37.5 (ref 36.4–46.3)
WBC: 14.3 10*3/uL — ABNORMAL HIGH (ref 4.0–11.0)

## 2023-10-30 MED ORDER — ACETAMINOPHEN 500 MG PO TABS
500 | ORAL_TABLET | Freq: Four times a day (QID) | ORAL | 0 refills | Status: AC | PRN
Start: 2023-10-30 — End: ?

## 2023-10-30 MED ORDER — ENOXAPARIN SODIUM 40 MG/0.4ML IJ SOSY
40 | Freq: Every day | INTRAMUSCULAR | 0 refills | Status: AC
Start: 2023-10-30 — End: 2023-11-15

## 2023-10-30 MED ORDER — URSODIOL 250 MG PO TABS
250 | ORAL_TABLET | Freq: Two times a day (BID) | ORAL | 1 refills | Status: AC
Start: 2023-10-30 — End: ?

## 2023-10-30 MED ORDER — OMEPRAZOLE 40 MG PO CPDR
40 | ORAL_CAPSULE | Freq: Every day | ORAL | 0 refills | Status: AC
Start: 2023-10-30 — End: ?

## 2023-10-30 MED ORDER — GABAPENTIN 100 MG PO CAPS
100 | ORAL_CAPSULE | Freq: Three times a day (TID) | ORAL | 0 refills | Status: AC
Start: 2023-10-30 — End: 2023-11-04

## 2023-10-30 MED ORDER — ONDANSETRON 8 MG PO TBDP
8 | ORAL_TABLET | ORAL | 1 refills | Status: AC
Start: 2023-10-30 — End: ?

## 2023-10-30 MED FILL — SODIUM CHLORIDE FLUSH 0.9 % IV SOLN: 0.9 % | INTRAVENOUS | Qty: 10

## 2023-10-30 MED FILL — ACETAMINOPHEN 10 MG/ML IV SOLN: 10 MG/ML | INTRAVENOUS | Qty: 100

## 2023-10-30 MED FILL — POTASSIUM CHLORIDE IN NACL 20-0.9 MEQ/L-% IV SOLN: INTRAVENOUS | Qty: 1000

## 2023-10-30 MED FILL — PANTOPRAZOLE SODIUM 40 MG IV SOLR: 40 MG | INTRAVENOUS | Qty: 40

## 2023-10-30 MED FILL — GABAPENTIN 250 MG/5ML PO SOLN: 250 MG/5ML | ORAL | Qty: 6

## 2023-10-30 MED FILL — KETOROLAC TROMETHAMINE 15 MG/ML IJ SOLN: 15 MG/ML | INTRAMUSCULAR | Qty: 1

## 2023-10-30 MED FILL — ENOXAPARIN SODIUM 40 MG/0.4ML IJ SOSY: 40 MG/0.4ML | INTRAMUSCULAR | Qty: 0.4

## 2023-10-30 NOTE — Progress Notes (Signed)
 Upon arrival to room patient sitting in bed with eyes opened, family at bedside ,tray to the side, reviewed items on tray as well as temperature variances for tolerance. Discussed changes in tastebuds and possible ways to combat issues. Reviewed size of

## 2023-10-30 NOTE — Progress Notes (Signed)
 [facility]  [phone]    Patient: Patricia Oneill Age: 32 y.o. Sex: female    Date of Birth: [birth year] Admit Date: 10/29/2023 PCP: None, None   MRN: [id number]  CSN: [id number]       Hospital Day: 0    32 y.o. female, 1 Day Post-Op status

## 2023-10-30 NOTE — Discharge Summary (Signed)
 Discharge Summary     Patient: Patricia Oneill MRN: [id number]  SSN: [id number]    Date of Birth: [birth year]  Age: 32 y.o.  Sex: female       Admit Date: 10/29/2023    Discharge Date: 10/30/2023      Admission Diagnoses:   Severe obesity [E66.01]  Hepatic stea

## 2023-12-02 ENCOUNTER — Encounter

## 2023-12-09 ENCOUNTER — Inpatient Hospital Stay: Admit: 2023-12-09 | Payer: BLUE CROSS/BLUE SHIELD

## 2023-12-09 NOTE — Progress Notes (Signed)
Dear  Dr. Lenise Arena,    I am pleased to report that Patricia Oneill a 33 y.o. female was seen today for their one month post-operative bariatric nutrition class.  Below details the information collected and the nutrition therapy/intervention provided during this appointment.       Type of surgery:            Roux-en-Y Gastric Bypass  Pre-Op weight:              298.25 lbs.  Expected weight loss:   139  lbs.  Goal weight:                  160 lbs.  Today's weight:             269.4 lbs.  Percent to Goal weight: 21 %    Weight loss goals by month:  1 month:     25%  3 months:   50%  6 months:   75%  12 months: 100%    Last Weight Metrics:      12/09/2023     4:35 PM 10/29/2023     7:35 AM 10/17/2023     8:10 AM 09/09/2023     8:52 AM 08/14/2023     8:32 AM 07/09/2023     8:32 AM 06/09/2023     1:00 PM   Weight Loss Metrics   Height 5\' 5"  5\' 5"  5\' 5"  5\' 5"  5\' 5"  5\' 5"  5\' 5"    Weight - Scale 269 lbs 6 oz 299 lbs 6 oz 298 lbs 301 lbs 13 oz 300 lbs 296 lbs 13 oz 295 lbs   BMI (Calculated) 44.9 kg/m2 49.9 kg/m2 49.7 kg/m2 50.3 kg/m2 50 kg/m2 49.5 kg/m2 49.2 kg/m2         Past Medical History includes:  Past Medical History:   Diagnosis Date    Anxiety and depression     Arthritis     Fatty liver     GERD (gastroesophageal reflux disease)     Obesity        Medication reconciliation not conducted due to class setting.  Current Medications:  Current Outpatient Medications   Medication Sig Dispense Refill    omeprazole (PRILOSEC) 40 MG delayed release capsule Take 1 capsule by mouth every morning (before breakfast) Open capsule and take contents. Take daily for 60 days following surgery. 60 capsule 0    gabapentin (NEURONTIN) 100 MG capsule Take 1 capsule by mouth every 8 (eight) hours for 5 days. Open capsule an take contents Max Daily Amount: 300 mg 15 capsule 0    acetaminophen (TYLENOL) 500 MG tablet Take 2 tablets by mouth every 6 hours as needed for Pain 100 tablet 0    ursodiol (ACTIGALL) 250 MG tablet Take 1 tablet by mouth in  the morning and at bedtime Begin taking 2 weeks after surgery. Take twice daily for 6 months to prevent Gallstone formation. 180 tablet 1    ondansetron (ZOFRAN-ODT) 8 MG TBDP disintegrating tablet Place one tablet under the tongue and allow it to dissolve every 8 hours as needed for Nausea or Vomiting 12 tablet 1    Multiple Vitamin (MULTIVITAMIN ADULT PO) Take 1 tablet by mouth daily       No current facility-administered medications for this encounter.       A food recall was obtained during today's visit. Oluwanifemi Rocha uses a food scale. Reported total weight of food currently consumed per meal is 3 ounce(s).  Below  is an example of a typical day:    Breakfast: Eggs with ricotta cheese (surgery date was October 29, 2023)  Lunch:       Youth worker [place]  Dinner:      [place] Gum pouch  Snacks:     N/A  NOTE: Advised patient to not advance volume or type of food choices outside of written/verbal guidelines.    Total fluid volume daily: 64 plus ounces    Protein Drink(s):    -Name of protein supplement(s):  Premier Protein or Oath Clear Whey    -Consuming 2 per day    63 Grams of protein from food  55 Grams of protein from protein supplements    Kamren Cohron reports taking the following dietary supplements (brand name, dose, and frequency):    -Multivitamin:  Celebrate One with 45 mg iron, QD  -Iron: In MVI   -Calcium citrate:  Fusion chews, 500 mg, TID  -Vitamin B1 (thiamine): Mason Naturals 250 mg/day (states she had low vitamin B1 levels prior to surgery)  -Vitamin B12: Nature Wise 1000 mcg daily (was not sure why she was taking these; advised her this was not necessary as the MVI has adequate amount; unless her laboratory values indicate low vitamin B12 levels)   -Other: N/A    Physical Activity  Masie Lerner exercise regimen (type/frequency): Walking and light weights, 3-4 times weekly.     Complications or Adverse Events  Over the past 3 weeks Roseline Pilot reports the following problem areas: Initially after surgery  had issues with nausea, this has reportedly resolved, no vomiting or dumping, meals take 20-30 minutes to consume, following the 30-30 rule regarding solids/liquids, and chews each bite 25 plus times.     Jodean Martinek received instruction on the incremental advancement of their diet based on post-operative month. Practiced planning meals and appropriate portion sizes at those meals using a food scale and portion control utensils, the bariatric plate method, and bariatric food models. We practiced reading food labels to reinforce the 3 gram rule for total fat and added sugar. Discussed and reinforced the daily goals for hydration, protein, physical activity, and dietary supplements. The benefits of keeping detailed daily food/physical activity records for long term weight loss success were also discussed. Provided guidance for prevention or resolution of issues associated with bariatric surgery post-operatively such as nausea, vomiting, constipation, and dumping.  Discussed the identification of or the potential for changes in emotional status along with recommended resources. Provided question and answer opportunities throughout the class along with provision of detailed written materials to reinforce application of key concepts.     Diet specific advancement instructions:     Starting on the second month post-op: Total of 1.5 to 2.0 ounces per meal      -1.0 to 1.5 ounces of protein      -0.5 to 1.0 ounce soft, well-cooked non-starchy vegetable or soft, skinless, seedless fruit at each meal      -Continue drinking two protein supplements daily     Starting on the fourth month post-op: Total of 2.5 to 3.0 ounces per meal      -1.5 to 2.0 ounces of protein       -1.0 to 1.5 ounces of vegetables or fruit  (may begin firmly cooked and raw vegetables, salad, and fresh fruits with seeds and skin)       -Continue drinking two protein supplements daily        Reinforced the benefits of exercise in regards to weight  loss  and overall wellness. Goal is no less than 150 minutes of moderate intensity exercise per week consisting of 30 minutes 5 days per week or 50 minutes 3 days per week (no more than two days between exercise sessions).  Additionally, recommended two days of strength training.    Chantal Massoud was a pleasure to work with today.  My contact information was provided for questions as needed.    Alessandra Grout, RD   Registered Dietitian Nutritionist  [facility]  [address], [place], [place], [place] [address]  [phone]

## 2024-01-23 ENCOUNTER — Inpatient Hospital Stay: Admit: 2024-01-23 | Discharge: 2024-01-23 | Payer: BLUE CROSS/BLUE SHIELD

## 2024-01-23 DIAGNOSIS — K912 Postsurgical malabsorption, not elsewhere classified: Secondary | ICD-10-CM

## 2024-01-23 LAB — CBC WITH AUTO DIFFERENTIAL
Basophils: 0.3 % (ref 0–3)
Eosinophils: 0.8 % (ref 0–5)
Hematocrit: 37.1 % (ref 35.0–47.0)
Hemoglobin: 11.2 g/dL (ref 11.0–16.0)
Immature Granulocytes %: 0.3 % (ref 0.0–3.0)
Lymphocytes: 21.9 % — ABNORMAL LOW (ref 28–48)
MCH: 20.4 pg — ABNORMAL LOW (ref 25.4–34.6)
MCHC: 30.2 g/dL (ref 30.0–36.0)
MCV: 67.5 fL — ABNORMAL LOW (ref 80.0–98.0)
Monocytes: 6.4 % (ref 1–13)
Neutrophils Segmented: 70.3 % — ABNORMAL HIGH (ref 34–64)
Nucleated RBCs: 0 (ref 0–0)
Platelets: 275 10*3/uL (ref 140–450)
RBC: 5.5 M/uL — ABNORMAL HIGH (ref 3.60–5.20)
RDW: 37.9 (ref 36.4–46.3)
WBC: 9.9 10*3/uL (ref 4.0–11.0)

## 2024-01-23 LAB — COMPREHENSIVE METABOLIC PANEL
ALT: 18 U/L (ref 10–49)
AST: 17 U/L (ref 0.0–33.9)
Albumin: 4.1 g/dL (ref 3.4–5.0)
Alkaline Phosphatase: 68 U/L (ref 46–116)
Anion Gap: 12 mmol/L (ref 5–15)
BUN: 18 mg/dL (ref 9–23)
CO2: 23 meq/L (ref 20–31)
Calcium: 9.7 mg/dL (ref 8.7–10.4)
Chloride: 103 meq/L (ref 98–107)
Creatinine: 0.69 mg/dL (ref 0.55–1.02)
GFR African American: >60
GFR Non-African American: >60
Glucose: 52 mg/dL — ABNORMAL LOW (ref 74–106)
Potassium: 3.5 meq/L (ref 3.5–5.1)
Sodium: 138 meq/L (ref 136–145)
Total Bilirubin: 0.8 mg/dL (ref 0.30–1.20)
Total Protein: 8 g/dL (ref 5.7–8.2)

## 2024-01-23 LAB — VITAMIN D 25 HYDROXY: Vit D, 25-Hydroxy: 24.3 ng/mL — ABNORMAL LOW (ref 30.0–100.0)

## 2024-01-23 LAB — VITAMIN B12: Vitamin B-12: 1108 pg/mL — ABNORMAL HIGH (ref 211–911)

## 2024-01-23 LAB — FERRITIN: Ferritin: 54.2 ng/mL (ref 7.3–270.7)

## 2024-01-23 LAB — HEMOGLOBIN A1C: Hemoglobin A1C: 4.3 % (ref 3.8–5.6)

## 2024-01-23 LAB — PTH, INTACT: Pth Intact: 71.7 pg/mL (ref 18.5–88.0)

## 2024-01-23 LAB — TSH: TSH, High Sensitivity: 1.831 u[IU]/mL (ref 0.550–4.780)

## 2024-01-23 LAB — FOLATE: Folate: 27.12 ng/mL — ABNORMAL HIGH (ref 5.38–24.00)

## 2024-01-26 LAB — ZINC: Zinc: 80 ug/dL (ref 60–130)

## 2024-01-27 LAB — VITAMIN A: RETINOL (VITAMIN A): 38 ug/dL (ref 38–98)

## 2024-01-28 LAB — VITAMIN B1, WHOLE BLOOD: Vitamin B1,Whole Blood: 125 nmol/L (ref 78–185)

## 2024-01-29 ENCOUNTER — Inpatient Hospital Stay: Admit: 2024-01-29 | Payer: BLUE CROSS/BLUE SHIELD

## 2024-01-29 NOTE — Discharge Instructions (Signed)
 Starting on the second month post-op: Total of 1.5 to 2.0 ounces per meal      -1.0 to 1.5 ounces of protein      -0.5 to 1.0 ounce soft, well-cooked non-starchy vegetable or soft, skinless, seedless fruit at each meal      -Continue drinking two protein supplements daily      Starting on the fourth month post-op: Total of 2.5 to 3.0 ounces per meal      -1.5 to 2.0 ounces of protein       -1.0 to 1.5 ounces of vegetables or fruit  (may begin firmly cooked and raw vegetables, salad, and fresh fruits with seeds and skin)       -Continue drinking two protein supplements daily      Starting on the sixth month post-op: Total of 3.5 to 4.5 ounces per meal      -2.0 to 3.0 ounces of protein      -1.0 to 2.0 ounces of vegetables and/or fruit      -0.5 ounce of complex carbohydrate       -One protein supplement daily      Starting on the ninth month post-op and beyond: Total of 6.0 ounces per meal      -3.0 to 3.5 ounces of protein      -1.0 to 2.0 ounces of vegetables and/or fruit      -1.0 ounce of complex carbohydrates      -Zero to one protein supplement daily

## 2024-01-29 NOTE — Progress Notes (Signed)
 Dear Dr. Lenise Arena,    I am pleased to report that Patricia Oneill a 33 y.o. female was seen today for their 3 month post-operative bariatric nutrition class.  Below details the information collected and the nutrition therapy/intervention provided during this appointment.       Type of surgery:            Roux-en-Y Gastric Bypass  Pre-Op weight:              298.3 lbs.  10/09/23  Expected weight loss:   138.6  lbs.  Goal weight:                  160 lbs.  Today's weight:             241.8 lbs.  Percent to Goal weight: 41 %    Weight loss goals by month:  1 month:     25%  3 months:   50%  6 months:   75%  12 months: 100%    Last Weight Metrics:      01/29/2024     3:16 PM 12/09/2023     4:35 PM 10/29/2023     7:35 AM 10/17/2023     8:10 AM 09/09/2023     8:52 AM 08/14/2023     8:32 AM 07/09/2023     8:32 AM   Weight Loss Metrics   Height 5\' 5"  5\' 5"  5\' 5"  5\' 5"  5\' 5"  5\' 5"  5\' 5"    Weight - Scale 241 lbs 13 oz 269 lbs 6 oz 299 lbs 6 oz 298 lbs 301 lbs 13 oz 300 lbs 296 lbs 13 oz   BMI (Calculated) 40.3 kg/m2 44.9 kg/m2 49.9 kg/m2 49.7 kg/m2 50.3 kg/m2 50 kg/m2 49.5 kg/m2         Past Medical History includes:  Past Medical History:   Diagnosis Date    Anxiety and depression     Arthritis     Fatty liver     GERD (gastroesophageal reflux disease)     Obesity        Medication reconciliation not conducted due to class setting.  Current Medications:  Current Outpatient Medications   Medication Sig Dispense Refill    omeprazole (PRILOSEC) 40 MG delayed release capsule Take 1 capsule by mouth every morning (before breakfast) Open capsule and take contents. Take daily for 60 days following surgery. 60 capsule 0    gabapentin (NEURONTIN) 100 MG capsule Take 1 capsule by mouth every 8 (eight) hours for 5 days. Open capsule an take contents Max Daily Amount: 300 mg 15 capsule 0    acetaminophen (TYLENOL) 500 MG tablet Take 2 tablets by mouth every 6 hours as needed for Pain 100 tablet 0    ursodiol (ACTIGALL) 250 MG tablet Take 1 tablet  by mouth in the morning and at bedtime Begin taking 2 weeks after surgery. Take twice daily for 6 months to prevent Gallstone formation. 180 tablet 1    ondansetron (ZOFRAN-ODT) 8 MG TBDP disintegrating tablet Place one tablet under the tongue and allow it to dissolve every 8 hours as needed for Nausea or Vomiting 12 tablet 1    Multiple Vitamin (MULTIVITAMIN ADULT PO) Take 1 tablet by mouth daily       No current facility-administered medications for this encounter.         A food recall was obtained during today's visit. Patricia Oneill uses use a food scale. Reported total weight of food currently consumed per  meal is 2 ounce(s).  Below is an example of a typical day:    Breakfast: egg/chicken sausage  Lunch:       gr [place], green beans, riced cauliflower  Dinner:      chicken, peas, carrots  Snacks:         Total fluid volume daily: 64-72 ounces    Protein Drink(s):    -Name of protein supplement(s):  Premier    -Consuming 2 per day    28 Grams of protein from food  60 Grams of protein from protein supplements    Patricia Oneill reports taking the following dietary supplements (brand name, dose, and frequency):    -Multivitamin:  Bariatric Pal 45mg   -Iron: above   -Calcium citrate:  same 500mg  TID  -Vitamin B1 (thiamine): Bariatric Adv.   -Vitamin B12: B-50   -Other:     Physical Activity  Patricia Oneill exercise regimen (type/frequency): walking/weights 4/week, pedometer 10-12K/day     Complications or Adverse Events  Over the past 3 weeks Patricia Oneill reports the following problem areas: none     Patricia Oneill received instruction on the incremental advancement of their diet based on post-operative month. Practiced planning meals and appropriate portion sizes at those meals using a food scale and portion control utensils, the bariatric plate method, and bariatric food models. We practiced reading food labels to reinforce the 3 gram rule for total fat and added sugar. Discussed and reinforced the daily goals for  hydration, protein, physical activity, and dietary supplements. The benefits of keeping detailed daily food/physical activity records for long term weight loss success were also discussed. Provided guidance for prevention or resolution of issues associated with bariatric surgery post-operatively such as nausea, vomiting, constipation, and dumping.  Discussed the identification of or the potential for changes in emotional status along with recommended resources. Encouraged continued bariatric support group attendance for a higher chance of long-term success. Provided question and answer opportunities throughout the class along with provision of detailed written materials to reinforce application of key concepts.   Encouraged post-op 3rd Tuesday support group meetings    Diet specific advancement instructions:     Starting on the second month post-op: Total of 1.5 to 2.0 ounces per meal      -1.0 to 1.5 ounces of protein      -0.5 to 1.0 ounce soft, well-cooked non-starchy vegetable or soft, skinless, seedless fruit at each meal      -Continue drinking two protein supplements daily     Starting on the fourth month post-op: Total of 2.5 to 3.0 ounces per meal      -1.5 to 2.0 ounces of protein       -1.0 to 1.5 ounces of vegetables or fruit  (may begin firmly cooked and raw vegetables, salad, and fresh fruits with seeds and skin)       -Continue drinking two protein supplements daily     Starting on the sixth month post-op: Total of 3.5 to 4.5 ounces per meal      -2.0 to 3.0 ounces of protein      -1.0 to 2.0 ounces of vegetables and/or fruit      -0.5 ounce of complex carbohydrate       -One protein supplement daily     Starting on the ninth month post-op and beyond: Total of 6.0 ounces per meal      -3.0 to 3.5 ounces of protein      -1.0 to 2.0 ounces of vegetables  and/or fruit      -1.0 ounce of complex carbohydrates      -Zero to one protein supplement daily        Reinforced the benefits of exercise in regards  to weight loss and overall wellness. Goal is no less than 150 minutes of moderate intensity exercise per week consisting of 30 minutes 5 days per week or 50 minutes 3 days per week (no more than two days between exercise sessions).  Additionally, recommended two days of strength training.    Patricia Oneill was a pleasure to work with today.  My contact information was provided for questions as needed.    Patricia Oneill, RD   Registered Dietitian Nutritionist  [facility]  [address], [place], [place], [place] [address]  [phone]

## 2024-05-07 ENCOUNTER — Inpatient Hospital Stay: Admit: 2024-05-07 | Discharge: 2024-05-07 | Payer: BLUE CROSS/BLUE SHIELD

## 2024-05-07 DIAGNOSIS — K912 Postsurgical malabsorption, not elsewhere classified: Secondary | ICD-10-CM

## 2024-05-07 LAB — CBC WITH AUTO DIFFERENTIAL
Basophils: 0.2 % (ref 0–3)
Eosinophils: 0.9 % (ref 0–5)
Hematocrit: 35 % (ref 35.0–47.0)
Hemoglobin: 10.5 g/dL — ABNORMAL LOW (ref 11.0–16.0)
Immature Granulocytes %: 0.3 % (ref 0.0–3.0)
Lymphocytes: 28.1 % (ref 28–48)
MCH: 20.3 pg — ABNORMAL LOW (ref 25.4–34.6)
MCHC: 30 g/dL (ref 30.0–36.0)
MCV: 67.8 fL — ABNORMAL LOW (ref 80.0–98.0)
Monocytes: 7.7 % (ref 1–13)
Neutrophils Segmented: 62.8 % (ref 34–64)
Nucleated RBCs: 0 (ref 0–0)
Platelets: 289 10*3/uL (ref 140–450)
RBC: 5.16 M/uL (ref 3.60–5.20)
RDW: 37.8 (ref 36.4–46.3)
WBC: 9 10*3/uL (ref 4.0–11.0)

## 2024-05-07 LAB — PTH, INTACT: Pth Intact: 76 pg/mL (ref 18.5–88.0)

## 2024-05-07 LAB — COMPREHENSIVE METABOLIC PANEL
ALT: 12 U/L (ref 10–49)
AST: 14 U/L (ref 0.0–33.9)
Albumin: 3.6 g/dL (ref 3.4–5.0)
Alkaline Phosphatase: 66 U/L (ref 46–116)
Anion Gap: 9 mmol/L (ref 5–15)
BUN: 19 mg/dL (ref 9–23)
CO2: 27 meq/L (ref 20–31)
Calcium: 9.5 mg/dL (ref 8.7–10.4)
Chloride: 103 meq/L (ref 98–107)
Creatinine: 0.67 mg/dL (ref 0.55–1.02)
GFR African American: >60
GFR Non-African American: >60
Glucose: 39 mg/dL — CL (ref 74–106)
Potassium: 3.4 meq/L — ABNORMAL LOW (ref 3.5–5.1)
Sodium: 139 meq/L (ref 136–145)
Total Bilirubin: 0.5 mg/dL (ref 0.30–1.20)
Total Protein: 7.6 g/dL (ref 5.7–8.2)

## 2024-05-07 LAB — VITAMIN D 25 HYDROXY: Vit D, 25-Hydroxy: 33.2 ng/mL (ref 30.0–100.0)

## 2024-05-07 LAB — FERRITIN: Ferritin: 12 ng/mL (ref 7.3–270.7)

## 2024-05-07 LAB — TSH: TSH, High Sensitivity: 2.78 u[IU]/mL (ref 0.55–4.78)

## 2024-05-07 LAB — HEMOGLOBIN A1C: Hemoglobin A1C: 4.5 % (ref 3.8–5.6)

## 2024-05-10 LAB — VITAMIN A: RETINOL (VITAMIN A): 28 ug/dL — ABNORMAL LOW (ref 38–98)

## 2024-05-10 LAB — ZINC: Zinc: 65 ug/dL (ref 60–130)

## 2024-05-12 LAB — VITAMIN B1: Vitamin B1: 25 nmol/L (ref 8–30)
